# Patient Record
Sex: Female | Born: 1995 | Marital: Single | Smoking: Never smoker
Health system: Southern US, Community
[De-identification: ages and names within clinical notes are randomized; demographics above are authoritative.]

## PROBLEM LIST (undated history)

## (undated) ENCOUNTER — Emergency Department (HOSPITAL_COMMUNITY): Payer: Managed Care, Other (non HMO)

## (undated) HISTORY — PX: TONSILLECTOMY: SUR1361

## (undated) HISTORY — PX: FOOT SURGERY: SHX648

---
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
## 2018-12-05 ENCOUNTER — Encounter (HOSPITAL_BASED_OUTPATIENT_CLINIC_OR_DEPARTMENT_OTHER): Payer: Self-pay

## 2018-12-05 ENCOUNTER — Other Ambulatory Visit: Payer: Self-pay

## 2018-12-05 ENCOUNTER — Emergency Department (HOSPITAL_BASED_OUTPATIENT_CLINIC_OR_DEPARTMENT_OTHER)
Admission: EM | Admit: 2018-12-05 | Discharge: 2018-12-05 | Disposition: A | Discharge status: Home / Self Care | Payer: BLUE CROSS/BLUE SHIELD | Attending: Emergency Medicine | Admitting: Emergency Medicine

## 2018-12-05 DIAGNOSIS — M5432 Sciatica, left side: Secondary | ICD-10-CM

## 2018-12-05 DIAGNOSIS — M545 Low back pain: Secondary | ICD-10-CM | POA: Diagnosis present

## 2018-12-05 MED ORDER — METHOCARBAMOL 500 MG PO TABS: 500.0000 mg | ORAL_TABLET | Freq: Three times a day (TID) | ORAL | 0 refills | Status: DC | PRN

## 2018-12-05 MED ORDER — PREDNISONE 50 MG PO TABS
60.0000 mg | ORAL_TABLET | Freq: Once | ORAL | Status: AC
  Administered 2018-12-05: 60 mg via ORAL
  Filled 2018-12-05: qty 1

## 2018-12-05 MED ORDER — IBUPROFEN 200 MG PO TABS: 600.0000 mg | ORAL_TABLET | Freq: Three times a day (TID) | ORAL | 0 refills | Status: DC | PRN

## 2018-12-05 MED ORDER — PREDNISONE 20 MG PO TABS: 40.0000 mg | ORAL_TABLET | Freq: Every day | ORAL | 0 refills | Status: DC

## 2018-12-05 MED ORDER — OXYCODONE-ACETAMINOPHEN 5-325 MG PO TABS
1.0000 | ORAL_TABLET | Freq: Once | ORAL | Status: AC
  Administered 2018-12-05: 1 via ORAL
  Filled 2018-12-05: qty 1

## 2018-12-05 NOTE — ED Triage Notes (Signed)
Pt reports chronic back pain first began 2 years ago.  Has seen pmd and neurosurgery consult without improvement.  Current pain worsened since yesterday.  Took ibuprofen last night with little improvement.  Pt able to ambulate but with pain.  Pain located lower left area of back.  Pain shoots down left leg at times.

## 2018-12-06 NOTE — ED Provider Notes (Signed)
[facility] EMERGENCY DEPARTMENT Provider Note   CSN: [id number] Arrival date & time: 12/05/18  8453    History   Chief Complaint Chief Complaint  Patient presents with  . Back Pain    HPI Melinda Meyer is a 23 y.o. female.     HPI Patient is a 23 year old female presents with low back pain radiation down her left buttock.  She states pain is been intermittent over the past 2 years.  She was diagnosed with an L5-S1 herniated disc with left foraminal encroachment 2 years ago based on MRI.  She states that she continues having intermittent pain.  Her pain is not changed is just been persistent.  She is able to ambulate but reports pain.  No fevers or chills.  She tried ibuprofen last night without significant improvement.  Her current episode is been worsening since last night.  No weakness of her lower extremities.  No bowel or bladder complaints.  Denies abdominal pain.   History reviewed. No pertinent past medical history.  There are no active problems to display for this patient.   Past Surgical History:  Procedure Laterality Date  . FOOT SURGERY    . TONSILLECTOMY       OB History   No obstetric history on file.      Home Medications    Prior to Admission medications   Medication Sig Start Date End Date Taking? Authorizing Provider  ibuprofen (ADVIL,MOTRIN) 200 MG tablet Take 3 tablets (600 mg total) by mouth every 8 (eight) hours as needed for moderate pain. 12/05/18   Azalia Bilis, MD  methocarbamol (ROBAXIN) 500 MG tablet Take 1 tablet (500 mg total) by mouth every 8 (eight) hours as needed for muscle spasms. 12/05/18   Azalia Bilis, MD  predniSONE (DELTASONE) 20 MG tablet Take 2 tablets (40 mg total) by mouth daily. 12/05/18   Azalia Bilis, MD    Family History History reviewed. No pertinent family history.  Social History Social History   Tobacco Use  . Smoking status: Never Smoker  . Smokeless tobacco: Never Used  Substance Use Topics  .  Alcohol use: No  . Drug use: No     Allergies   Patient has no known allergies.   Review of Systems Review of Systems  All other systems reviewed and are negative.    Physical Exam Updated Vital Signs BP 124/77 (BP Location: Right Arm)   Pulse 84   Temp 97.7 F (36.5 C) (Oral)   Resp 16   Ht 5\' 8"  (1.727 m)   Wt 68 kg   SpO2 100%   BMI 22.81 kg/m   Physical Exam Vitals signs and nursing note reviewed.  Constitutional:      Appearance: She is well-developed.  HENT:     Head: Normocephalic.  Neck:     Musculoskeletal: Normal range of motion.  Pulmonary:     Effort: Pulmonary effort is normal.  Abdominal:     General: There is no distension.  Musculoskeletal:     Comments: Full range of motion of bilateral hips, knees, ankles.  5 out of 5 strength in bilateral lower extremity major muscle groups.  No L-spine point tenderness.  Tenderness in the left sciatic groove  Neurological:     Mental Status: She is alert and oriented to person, place, and time.      ED Treatments / Results  Labs (all labs ordered are listed, but only abnormal results are displayed) Labs Reviewed - No data to display  EKG None  Radiology No results found.  Procedures Procedures (including critical care time)  Medications Ordered in ED Medications  predniSONE (DELTASONE) tablet 60 mg (60 mg Oral Given 12/05/18 1008)  oxyCODONE-acetaminophen (PERCOCET/ROXICET) 5-325 MG per tablet 1 tablet (1 tablet Oral Given 12/05/18 1008)     Initial Impression / Assessment and Plan / ED Course  I have reviewed the triage vital signs and the nursing notes.  Pertinent labs & imaging results that were available during my care of the patient were reviewed by me and considered in my medical decision making (see chart for details).        Left-sided sciatica.  No weakness in her lower extremities.  No indication for advanced imaging.  Home with Robaxin, prednisone, Advil.  She understands to  follow-up with her primary care physician and her neurosurgical team.  Recommended stretching and physical therapy.  Final Clinical Impressions(s) / ED Diagnoses   Final diagnoses:  Sciatica, left side    ED Discharge Orders         Ordered    methocarbamol (ROBAXIN) 500 MG tablet  Every 8 hours PRN     12/05/18 1014    predniSONE (DELTASONE) 20 MG tablet  Daily     12/05/18 1014    ibuprofen (ADVIL,MOTRIN) 200 MG tablet  Every 8 hours PRN     12/05/18 1014           Azalia Bilis, MD 12/06/18 [phone]

## 2020-03-14 ENCOUNTER — Other Ambulatory Visit: Payer: Self-pay

## 2020-03-14 ENCOUNTER — Encounter (HOSPITAL_BASED_OUTPATIENT_CLINIC_OR_DEPARTMENT_OTHER): Payer: Self-pay | Admitting: Emergency Medicine

## 2020-03-14 ENCOUNTER — Emergency Department (HOSPITAL_BASED_OUTPATIENT_CLINIC_OR_DEPARTMENT_OTHER): Payer: Managed Care, Other (non HMO)

## 2020-03-14 ENCOUNTER — Emergency Department (HOSPITAL_BASED_OUTPATIENT_CLINIC_OR_DEPARTMENT_OTHER)
Admission: EM | Admit: 2020-03-14 | Discharge: 2020-03-14 | Disposition: A | Discharge status: Home / Self Care | Payer: Managed Care, Other (non HMO) | Attending: Emergency Medicine | Admitting: Emergency Medicine

## 2020-03-14 DIAGNOSIS — M2540 Effusion, unspecified joint: Secondary | ICD-10-CM | POA: Insufficient documentation

## 2020-03-14 DIAGNOSIS — W502XXA Accidental twist by another person, initial encounter: Secondary | ICD-10-CM | POA: Insufficient documentation

## 2020-03-14 DIAGNOSIS — Y9389 Activity, other specified: Secondary | ICD-10-CM | POA: Insufficient documentation

## 2020-03-14 DIAGNOSIS — Y999 Unspecified external cause status: Secondary | ICD-10-CM | POA: Diagnosis not present

## 2020-03-14 DIAGNOSIS — M25571 Pain in right ankle and joints of right foot: Secondary | ICD-10-CM | POA: Diagnosis not present

## 2020-03-14 DIAGNOSIS — Y929 Unspecified place or not applicable: Secondary | ICD-10-CM | POA: Insufficient documentation

## 2020-03-14 DIAGNOSIS — S99919A Unspecified injury of unspecified ankle, initial encounter: Secondary | ICD-10-CM | POA: Diagnosis present

## 2020-03-14 MED ORDER — ONDANSETRON 4 MG PO TBDP
4.0000 mg | ORAL_TABLET | Freq: Once | ORAL | Status: AC
  Administered 2020-03-14: 4 mg via ORAL
  Filled 2020-03-14: qty 1

## 2020-03-14 MED ORDER — OXYCODONE-ACETAMINOPHEN 5-325 MG PO TABS
1.0000 | ORAL_TABLET | ORAL | Status: DC | PRN
  Administered 2020-03-14: 1 via ORAL
  Filled 2020-03-14: qty 1

## 2020-03-14 NOTE — ED Provider Notes (Signed)
[facility] EMERGENCY DEPARTMENT Provider Note   CSN: [id number] Arrival date & time: 03/14/20  1601     History Chief Complaint  Patient presents with  . Ankle Injury    Melinda Meyer is a 24 y.o. female with no pertinent past medical history who presents today for evaluation of pain in her right ankle.  She reports that she twisted her ankle.  She denies any other injuries.  No pain in her proximal right lower leg.  She denies any numbness or weakness.  States that she has been unable to bear weight due to pain.   No interventions tried prior to arrival.  She reports that her pain is made better with nonweightbearing and being still. HPI     History reviewed. No pertinent past medical history.  There are no problems to display for this patient.   Past Surgical History:  Procedure Laterality Date  . FOOT SURGERY    . TONSILLECTOMY       OB History   No obstetric history on file.     No family history on file.  Social History   Tobacco Use  . Smoking status: Never Smoker  . Smokeless tobacco: Never Used  Substance Use Topics  . Alcohol use: No  . Drug use: No    Home Medications Prior to Admission medications   Medication Sig Start Date End Date Taking? Authorizing Provider  ibuprofen (ADVIL,MOTRIN) 200 MG tablet Take 3 tablets (600 mg total) by mouth every 8 (eight) hours as needed for moderate pain. 12/05/18   Azalia Bilis, MD  methocarbamol (ROBAXIN) 500 MG tablet Take 1 tablet (500 mg total) by mouth every 8 (eight) hours as needed for muscle spasms. 12/05/18   Azalia Bilis, MD  predniSONE (DELTASONE) 20 MG tablet Take 2 tablets (40 mg total) by mouth daily. 12/05/18   Azalia Bilis, MD    Allergies    Patient has no known allergies.  Review of Systems   Review of Systems  Constitutional: Negative for fever.  Musculoskeletal: Positive for joint swelling.  Skin: Negative for wound.  Neurological: Negative for weakness.  All other systems  reviewed and are negative.   Physical Exam Updated Vital Signs BP 123/72 (BP Location: Left Arm)   Pulse 80   Temp 98.7 F (37.1 C) (Oral)   Resp 16   Ht 5' 7.5" (1.715 m)   Wt 75.8 kg   SpO2 99%   BMI 25.77 kg/m   Physical Exam Vitals and nursing note reviewed.  Constitutional:      General: She is not in acute distress.    Appearance: She is not ill-appearing.  HENT:     Head: Normocephalic.  Cardiovascular:     Rate and Rhythm: Normal rate.     Comments: 2+ right DP/PT pulse. Pulmonary:     Effort: Pulmonary effort is normal. No respiratory distress.  Musculoskeletal:     Comments: There is obvious edema around the right lateral malleolus.  There is no pain or tenderness to palpation in the proximal right lower leg.  There is no pain or tenderness to palpation into the right foot.  Skin:    Comments: Ecchymosis over the right lateral malleolus without abrasion, laceration or skin break.  Neurological:     Mental Status: She is alert.     Comments: Sensation intact to light touch to right lower extremity/foot.     ED Results / Procedures / Treatments   Labs (all labs ordered are listed, but  only abnormal results are displayed) Labs Reviewed - No data to display  EKG None  Radiology DG Ankle Complete Right  Result Date: 03/14/2020 CLINICAL DATA:  RIGHT ankle pain after twist injury tonight, pain and swelling at lateral malleolus, inability to dorsiflex foot EXAM: RIGHT ANKLE - COMPLETE 3+ VIEW COMPARISON:  None FINDINGS: Lateral soft tissue swelling. Osseous mineralization normal. Joint spaces preserved. No acute fracture, dislocation, or bone destruction. IMPRESSION: No acute osseous abnormalities. Electronically Signed   By: Lavonia Dana M.D.   On: 03/14/2020 16:57    Procedures Procedures (including critical care time)  Medications Ordered in ED Medications  oxyCODONE-acetaminophen (PERCOCET/ROXICET) 5-325 MG per tablet 1 tablet (1 tablet Oral Given 03/14/20  1715)  ondansetron (ZOFRAN-ODT) disintegrating tablet 4 mg (4 mg Oral Given 03/14/20 1708)    ED Course  I have reviewed the triage vital signs and the nursing notes.  Pertinent labs & imaging results that were available during my care of the patient were reviewed by me and considered in my medical decision making (see chart for details).    MDM Rules/Calculators/A&P                         Leandrew Koyanagi Presents with right ankle pain after she "twisted" her ankle consistent with an ankle sprain/strain.  The affected ankle has mild edema and is tender on the lateral  aspect.  X-rays were obtained with out acute osseous abnormality. The skin is intact to ankle/foot.  The foot is warm and well perfused with intact sensation.  Motor function is limited secondary to pain.  Patient given instructions for OTC pain medication, ACE wrap, crtuches.  Patient advised to follow up with orthopedics if symptoms persist for longer than one week.  Patient was given the option to ask questions, all of which were answered to the best of my ability.  Return precautions were discussed with patient who states their understanding.  At the time of discharge patient denied any unaddressed complaints or concerns.  Patient is agreeable for discharge home.  Note: Portions of this report may have been transcribed using voice recognition software. Every effort was made to ensure accuracy; however, inadvertent computerized transcription errors may be present Final Clinical Impression(s) / ED Diagnoses Final diagnoses:  Acute right ankle pain    Rx / DC Orders ED Discharge Orders    None       Lorin Glass, PA-C 03/14/20 1829    Malvin Johns, MD 03/14/20 [phone]

## 2020-03-14 NOTE — ED Triage Notes (Signed)
Twisted right ankle while standing today.  Swelling to lateral malleolus.  Did not fall.

## 2020-03-14 NOTE — Discharge Instructions (Signed)
Please take Ibuprofen (Advil, motrin) and Tylenol (acetaminophen) to relieve your pain.  You may take up to 600 MG (3 pills) of normal strength ibuprofen every 8 hours as needed.  In between doses of ibuprofen you make take tylenol, up to 1,000 mg (two extra strength pills).  Do not take more than 3,000 mg tylenol in a 24 hour period.  Please check all medication labels as many medications such as pain and cold medications may contain tylenol.  Do not drink alcohol while taking these medications.  Do not take other NSAID'S while taking ibuprofen (such as aleve or naproxen).  Please take ibuprofen with food to decrease stomach upset. ° °Today you received medications that may make you sleepy or impair your ability to make decisions.  For the next 24 hours please do not drive, operate heavy machinery, care for a small child with out another adult present, or perform any activities that may cause harm to you or someone else if you were to fall asleep or be impaired.  ° ° °

## 2020-03-20 ENCOUNTER — Encounter: Payer: Self-pay | Admitting: Family Medicine

## 2020-03-20 ENCOUNTER — Ambulatory Visit (INDEPENDENT_AMBULATORY_CARE_PROVIDER_SITE_OTHER): Payer: Managed Care, Other (non HMO) | Admitting: Family Medicine

## 2020-03-20 ENCOUNTER — Other Ambulatory Visit: Payer: Self-pay

## 2020-03-20 ENCOUNTER — Ambulatory Visit: Payer: Self-pay

## 2020-03-20 VITALS — BP 131/79 | HR 73 | Ht 68.0 in | Wt 165.0 lb

## 2020-03-20 DIAGNOSIS — S93491A Sprain of other ligament of right ankle, initial encounter: Secondary | ICD-10-CM

## 2020-03-20 DIAGNOSIS — M25571 Pain in right ankle and joints of right foot: Secondary | ICD-10-CM

## 2020-03-20 NOTE — Progress Notes (Signed)
Melinda Meyer - 24 y.o. female MRN [id number]  Date of birth: 1996-07-05  SUBJECTIVE:  Including CC & ROS.  Chief Complaint  Patient presents with  . Ankle Injury    right x 03/14/2020    Melinda Meyer is a 24 y.o. female that is presenting with right ankle pain.  She had an inversion injury about a week ago.  Since that time she has had ongoing swelling and pain over the lateral malleolus.  No history of similar pain.  Has been using crutches.  Denies any numbness or tingling.  Independent review of the right ankle x-ray from 6/24 shows some soft tissue swelling laterally.   Review of Systems See HPI   HISTORY: Past Medical, Surgical, Social, and Family History Reviewed & Updated per EMR.   Pertinent Historical Findings include:  No past medical history on file.  Past Surgical History:  Procedure Laterality Date  . FOOT SURGERY    . TONSILLECTOMY      No family history on file.  Social History   Socioeconomic History  . Marital status: Single    Spouse name: Not on file  . Number of children: Not on file  . Years of education: Not on file  . Highest education level: Not on file  Occupational History  . Not on file  Tobacco Use  . Smoking status: Never Smoker  . Smokeless tobacco: Never Used  Substance and Sexual Activity  . Alcohol use: No  . Drug use: No  . Sexual activity: Not on file  Other Topics Concern  . Not on file  Social History Narrative  . Not on file   Social Determinants of Health   Financial Resource Strain:   . Difficulty of Paying Living Expenses:   Food Insecurity:   . Worried About Programme researcher, broadcasting/film/video in the Last Year:   . Barista in the Last Year:   Transportation Needs:   . Freight forwarder (Medical):   Marland Kitchen Lack of Transportation (Non-Medical):   Physical Activity:   . Days of Exercise per Week:   . Minutes of Exercise per Session:   Stress:   . Feeling of Stress :   Social Connections:   . Frequency of  Communication with Friends and Family:   . Frequency of Social Gatherings with Friends and Family:   . Attends Religious Services:   . Active Member of Clubs or Organizations:   . Attends Banker Meetings:   Marland Kitchen Marital Status:   Intimate Partner Violence:   . Fear of Current or Ex-Partner:   . Emotionally Abused:   Marland Kitchen Physically Abused:   . Sexually Abused:      PHYSICAL EXAM:  VS: BP 131/79   Pulse 73   Ht 5\' 8"  (1.727 m)   Wt 165 lb (74.8 kg)   BMI 25.09 kg/m  Physical Exam Gen: NAD, alert, cooperative with exam, well-appearing MSK:  Right ankle: Significant effusion Ecchymosis over the lateral malleolus. Limited plantar flexion dorsiflexion. Pain with anterior drawer. Tender to palpation over the ATFL Neurovascularly intact  Limited ultrasound: Right ankle:  No effusion noted in the ankle joint. No changes of the distal fibula or lateral malleolus. No changes of the distal tibia. Normal-appearing peroneal tendons at the lateral malleolus. Significant hypoechoic change around the ATFL.  Hyperemia associated with the this area. Normal-appearing base of the fifth. No changes of the Lisfranc ligament.   Summary: Findings would suggest an ankle sprain of greater severity.  Ultrasound and interpretation by Clare Gandy, MD    ASSESSMENT & PLAN:   Sprain of anterior talofibular ligament of right ankle Initial injury on 6/24.  Changes on ultrasound appear mostly involving the ATFL.  No bony changes on previous imaging or ultrasound today. -Counseled on home exercise therapy and supportive care. -Cam walker. -Duexis samples. -Follow-up in 2 weeks.  Would reimage at that time.

## 2020-03-20 NOTE — Progress Notes (Signed)
Medication Samples have been provided to the patient.  Drug name: Duexis       Strength: 800mg /26.6mg         Qty: 2 boxes  LOT:  Exp.Date: 11/2020  Dosing instructions: take 1 tablet by mouth three (3) times a day.  The patient has been instructed regarding the correct time, dose, and frequency of taking this medication, including desired effects and most common side effects.   12/2020, MA 4:11 PM 03/20/2020

## 2020-03-20 NOTE — Patient Instructions (Signed)
Nice to meet you Please try ice  Please try the range of motion movements  Please continue the crutches for another week. Then you can try transitioning to using the boot  Please send me a message in MyChart with any questions or updates.  Please see me back in 2 weeks.   --Dr. Jordan Likes

## 2020-03-20 NOTE — Assessment & Plan Note (Signed)
Initial injury on 6/24.  Changes on ultrasound appear mostly involving the ATFL.  No bony changes on previous imaging or ultrasound today. -Counseled on home exercise therapy and supportive care. -Cam walker. -Duexis samples. -Follow-up in 2 weeks.  Would reimage at that time.

## 2020-04-03 ENCOUNTER — Encounter: Payer: Self-pay | Admitting: Family Medicine

## 2020-04-03 ENCOUNTER — Other Ambulatory Visit: Payer: Self-pay

## 2020-04-03 ENCOUNTER — Ambulatory Visit (HOSPITAL_BASED_OUTPATIENT_CLINIC_OR_DEPARTMENT_OTHER)
Admission: RE | Admit: 2020-04-03 | Discharge: 2020-04-03 | Disposition: A | Discharge status: Home / Self Care | Payer: Managed Care, Other (non HMO) | Source: Ambulatory Visit | Attending: Family Medicine | Admitting: Family Medicine

## 2020-04-03 ENCOUNTER — Ambulatory Visit (INDEPENDENT_AMBULATORY_CARE_PROVIDER_SITE_OTHER): Payer: Managed Care, Other (non HMO) | Admitting: Family Medicine

## 2020-04-03 VITALS — BP 119/75 | HR 80 | Ht 68.0 in | Wt 167.0 lb

## 2020-04-03 DIAGNOSIS — S93491D Sprain of other ligament of right ankle, subsequent encounter: Secondary | ICD-10-CM | POA: Insufficient documentation

## 2020-04-03 NOTE — Assessment & Plan Note (Signed)
Significant tenderness with palpation of the base of the fifth as well as the lateral malleolus.  Pain with weightbearing.  Concern for occult fracture. -Counseled on home exercise therapy and supportive care. -Foot and ankle x-ray. -Counseled on weaning out of the boot.  If too painful may need to consider an MRI to evaluate for OCD lesion. -Follow-up in 4 weeks.

## 2020-04-03 NOTE — Progress Notes (Signed)
  Melinda Meyer - 24 y.o. female MRN [id number]  Date of birth: [birth year]  SUBJECTIVE:  Including CC & ROS.  Chief Complaint  Patient presents with  . Follow-up    right ankle    Melinda Meyer is a 24 y.o. female that is following up for her right ankle pain.  Has been using the boot.  Still having ecchymosis and swelling of the lateral aspect.  Still pain with ambulation.  Unable to bear weight without significant pain.   Review of Systems See HPI   HISTORY: Past Medical, Surgical, Social, and Family History Reviewed & Updated per EMR.   Pertinent Historical Findings include:  No past medical history on file.  Past Surgical History:  Procedure Laterality Date  . FOOT SURGERY    . TONSILLECTOMY      No family history on file.  Social History   Socioeconomic History  . Marital status: Single    Spouse name: Not on file  . Number of children: Not on file  . Years of education: Not on file  . Highest education level: Not on file  Occupational History  . Not on file  Tobacco Use  . Smoking status: Never Smoker  . Smokeless tobacco: Never Used  Substance and Sexual Activity  . Alcohol use: No  . Drug use: No  . Sexual activity: Not on file  Other Topics Concern  . Not on file  Social History Narrative  . Not on file   Social Determinants of Health   Financial Resource Strain:   . Difficulty of Paying Living Expenses:   Food Insecurity:   . Worried About Programme researcher, broadcasting/film/video in the Last Year:   . Barista in the Last Year:   Transportation Needs:   . Freight forwarder (Medical):   Marland Kitchen Lack of Transportation (Non-Medical):   Physical Activity:   . Days of Exercise per Week:   . Minutes of Exercise per Session:   Stress:   . Feeling of Stress :   Social Connections:   . Frequency of Communication with Friends and Family:   . Frequency of Social Gatherings with Friends and Family:   . Attends Religious Services:   . Active Member of Clubs or  Organizations:   . Attends Banker Meetings:   Marland Kitchen Marital Status:   Intimate Partner Violence:   . Fear of Current or Ex-Partner:   . Emotionally Abused:   Marland Kitchen Physically Abused:   . Sexually Abused:      PHYSICAL EXAM:  VS: BP 119/75   Pulse 80   Ht 5\' 8"  (1.727 m)   Wt 167 lb (75.8 kg)   BMI 25.39 kg/m  Physical Exam Gen: NAD, alert, cooperative with exam, well-appearing MSK:  Right ankle and foot: Ecchymosis and swelling to the lateral aspect. Limited range of motion. Tenderness palpation at the base of the fifth as well as the distal fibula. Tenderness palpation of the ATFL. Neurovascularly intact     ASSESSMENT & PLAN:   Sprain of anterior talofibular ligament of right ankle Significant tenderness with palpation of the base of the fifth as well as the lateral malleolus.  Pain with weightbearing.  Concern for occult fracture. -Counseled on home exercise therapy and supportive care. -Foot and ankle x-ray. -Counseled on weaning out of the boot.  If too painful may need to consider an MRI to evaluate for OCD lesion. -Follow-up in 4 weeks.

## 2020-04-03 NOTE — Patient Instructions (Signed)
Good to see you Please continue ice  Please try to wean out of the boot in 2 weeks. This may change on if we see anything in the xrays.  Please try an ankle brace after the boot  Please continue the exercises  I will call with the results from today  Please send me a message in MyChart with any questions or updates.  Please see me back in 4 weeks .   --Dr. Jordan Likes

## 2020-04-15 ENCOUNTER — Telehealth: Payer: Self-pay | Admitting: Family Medicine

## 2020-04-15 NOTE — Telephone Encounter (Signed)
Left VM for patient. If she calls back please have her speak with a nurse/CMA and inform that her xrays are normal. If she is still having pain with walking we may need to try physical therapy or proceed with an MRI.   If any questions then please take the best time and phone number to call and I will try to call her back.   Myra Rude, MD [facility] 04/15/2020, 8:29 AM

## 2022-05-10 IMAGING — CR DG ANKLE COMPLETE 3+V*R*
3 series · 3 of 3 positions shown · non-contrast
Comparison: None.

CLINICAL DATA: Injury 2 weeks ago with ankle pain

EXAM:
RIGHT ANKLE - COMPLETE 3+ VIEW

[t ankle joint ap right]
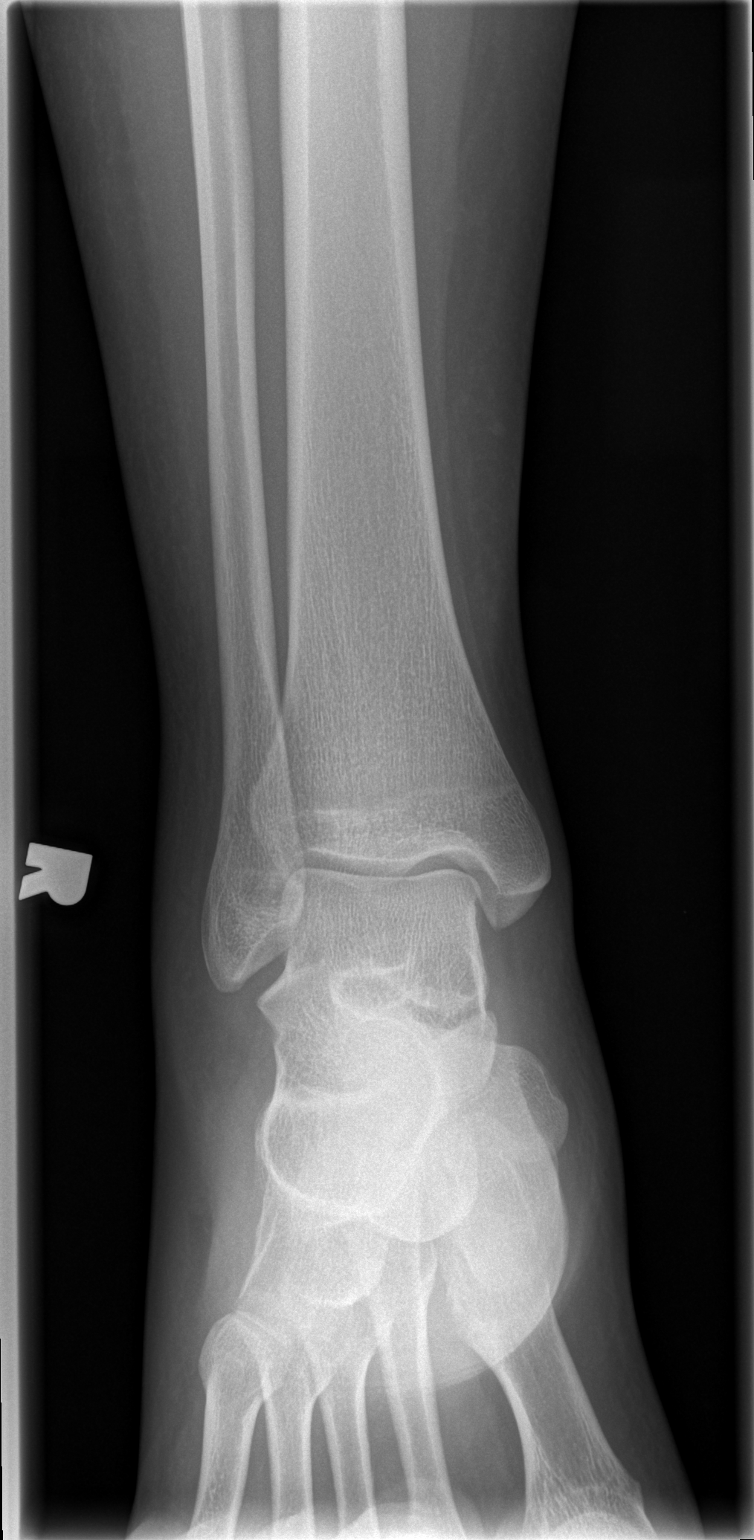

[t ankle joint oblique right]
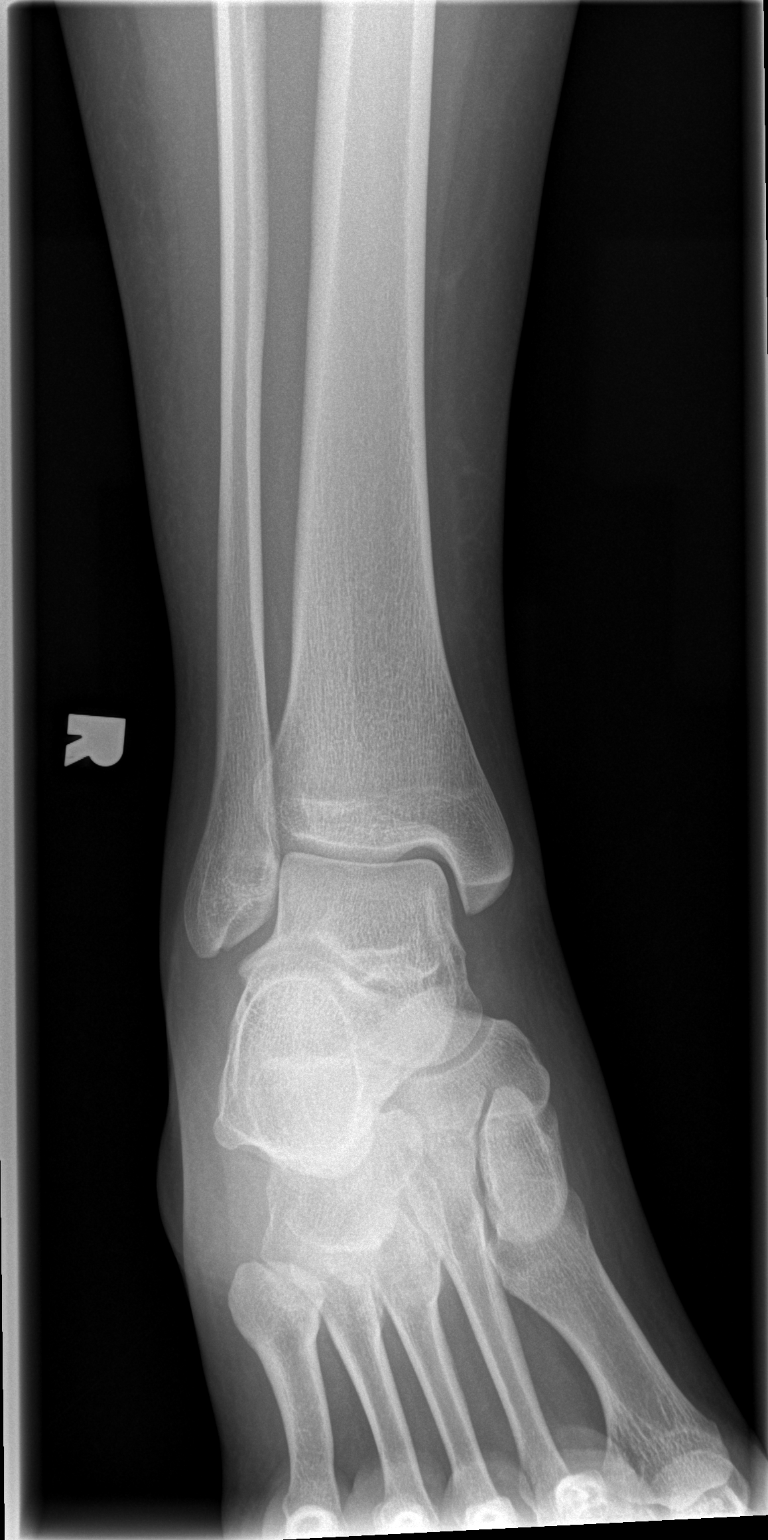

[t ankle joint lat right]
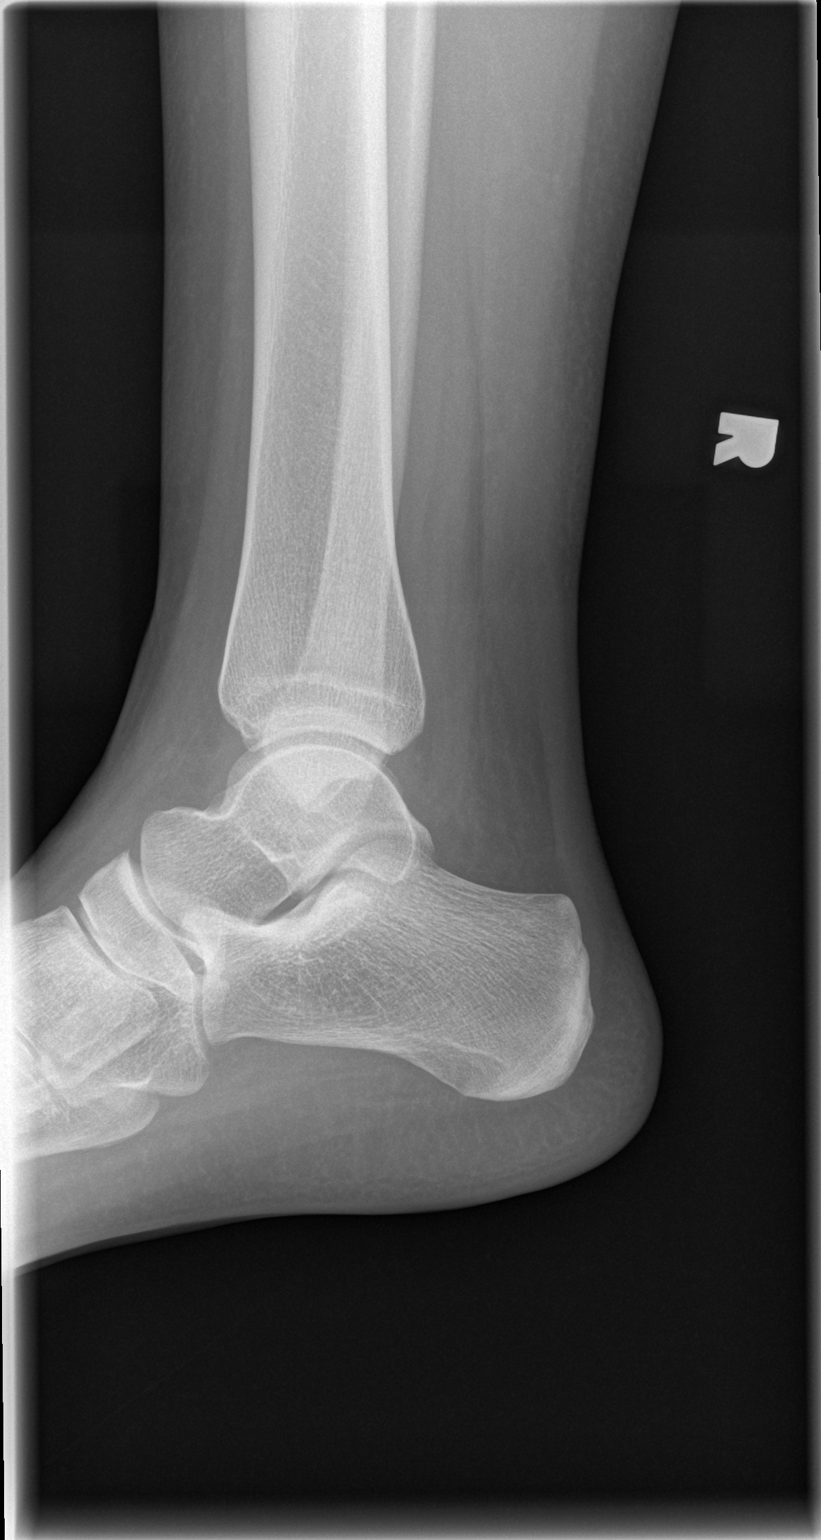

[3 of 3 positions shown; findings below may reference images not displayed]

FINDINGS: There is no evidence of fracture, dislocation, or joint effusion.
There is no evidence of arthropathy or other focal bone abnormality.
Soft tissues are unremarkable.
IMPRESSION: Normal radiographs

## 2022-05-10 IMAGING — CR DG FOOT COMPLETE 3+V*R*
3 series · 3 of 3 positions shown · non-contrast
Comparison: None.

CLINICAL DATA: Injury 2 weeks ago with right foot pain.

EXAM:
RIGHT FOOT COMPLETE - 3+ VIEW

[t foot ap right]
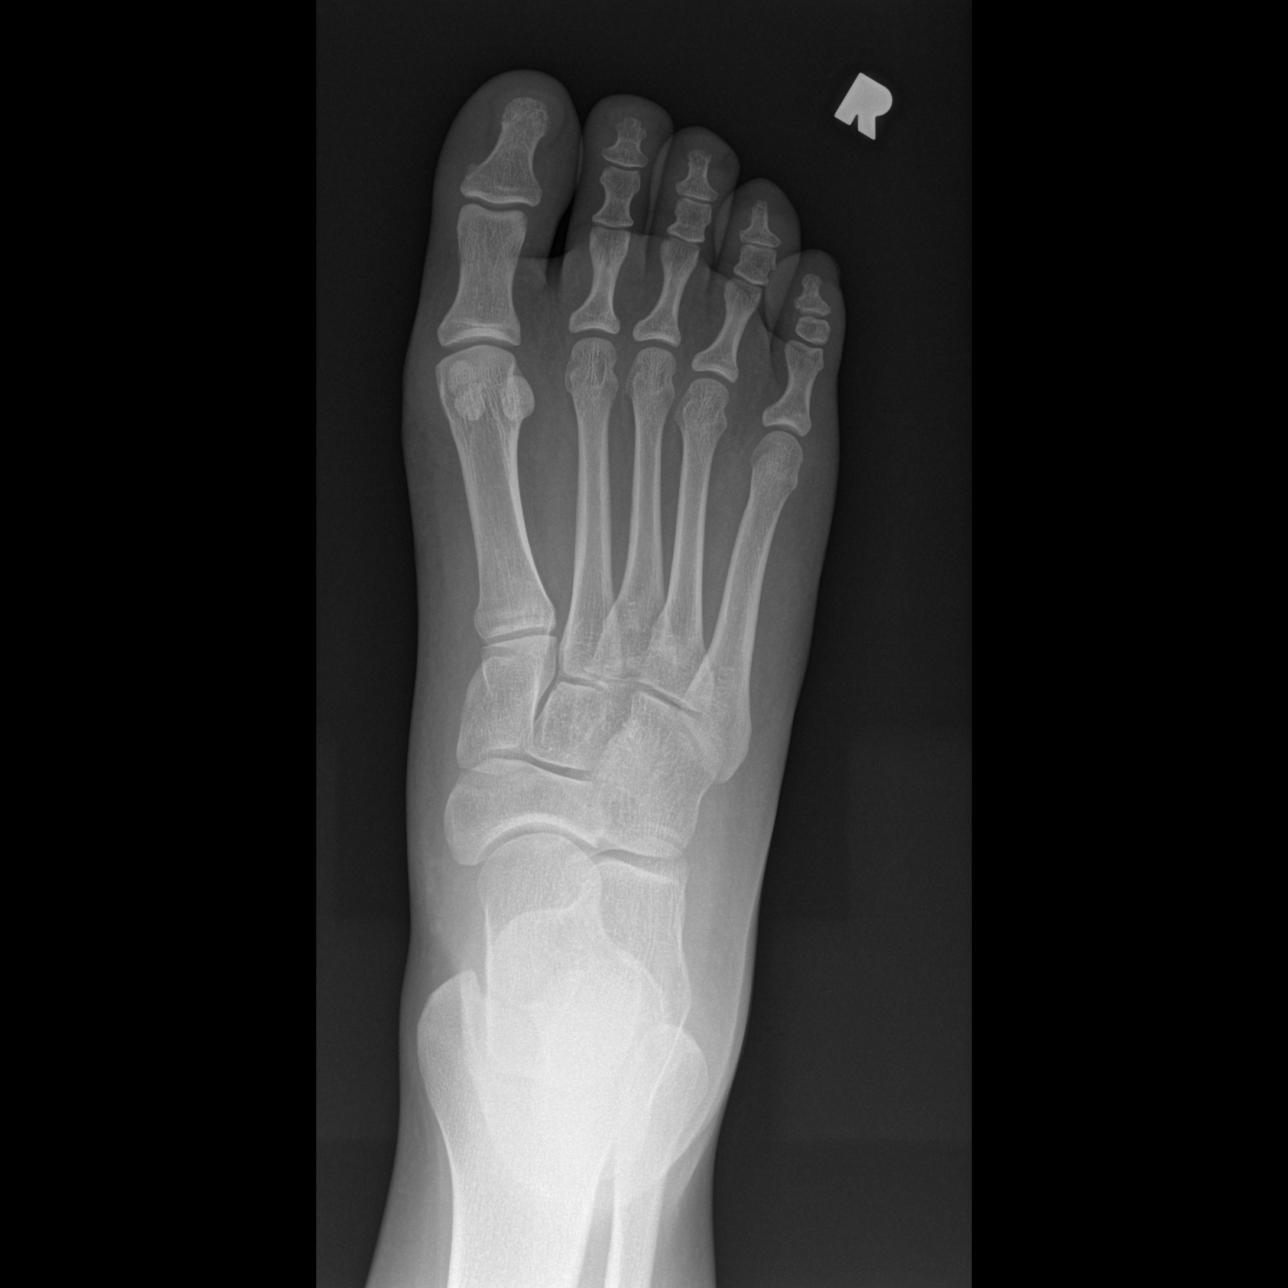

[t foot oblique right]
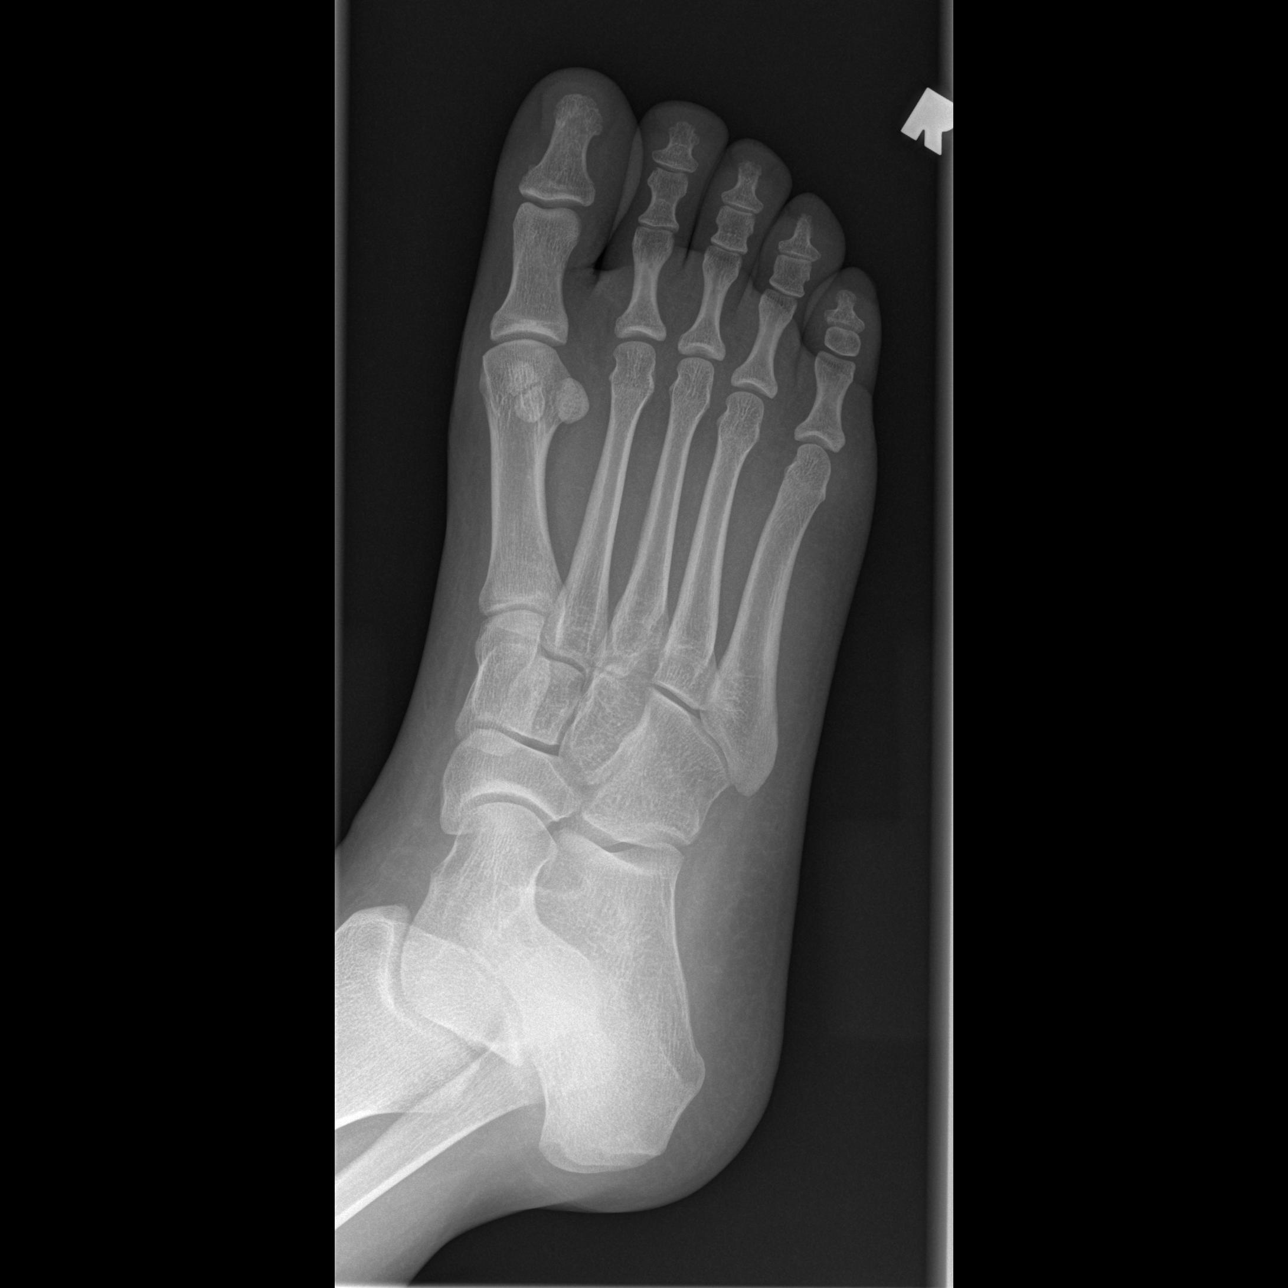

[t foot lat right]
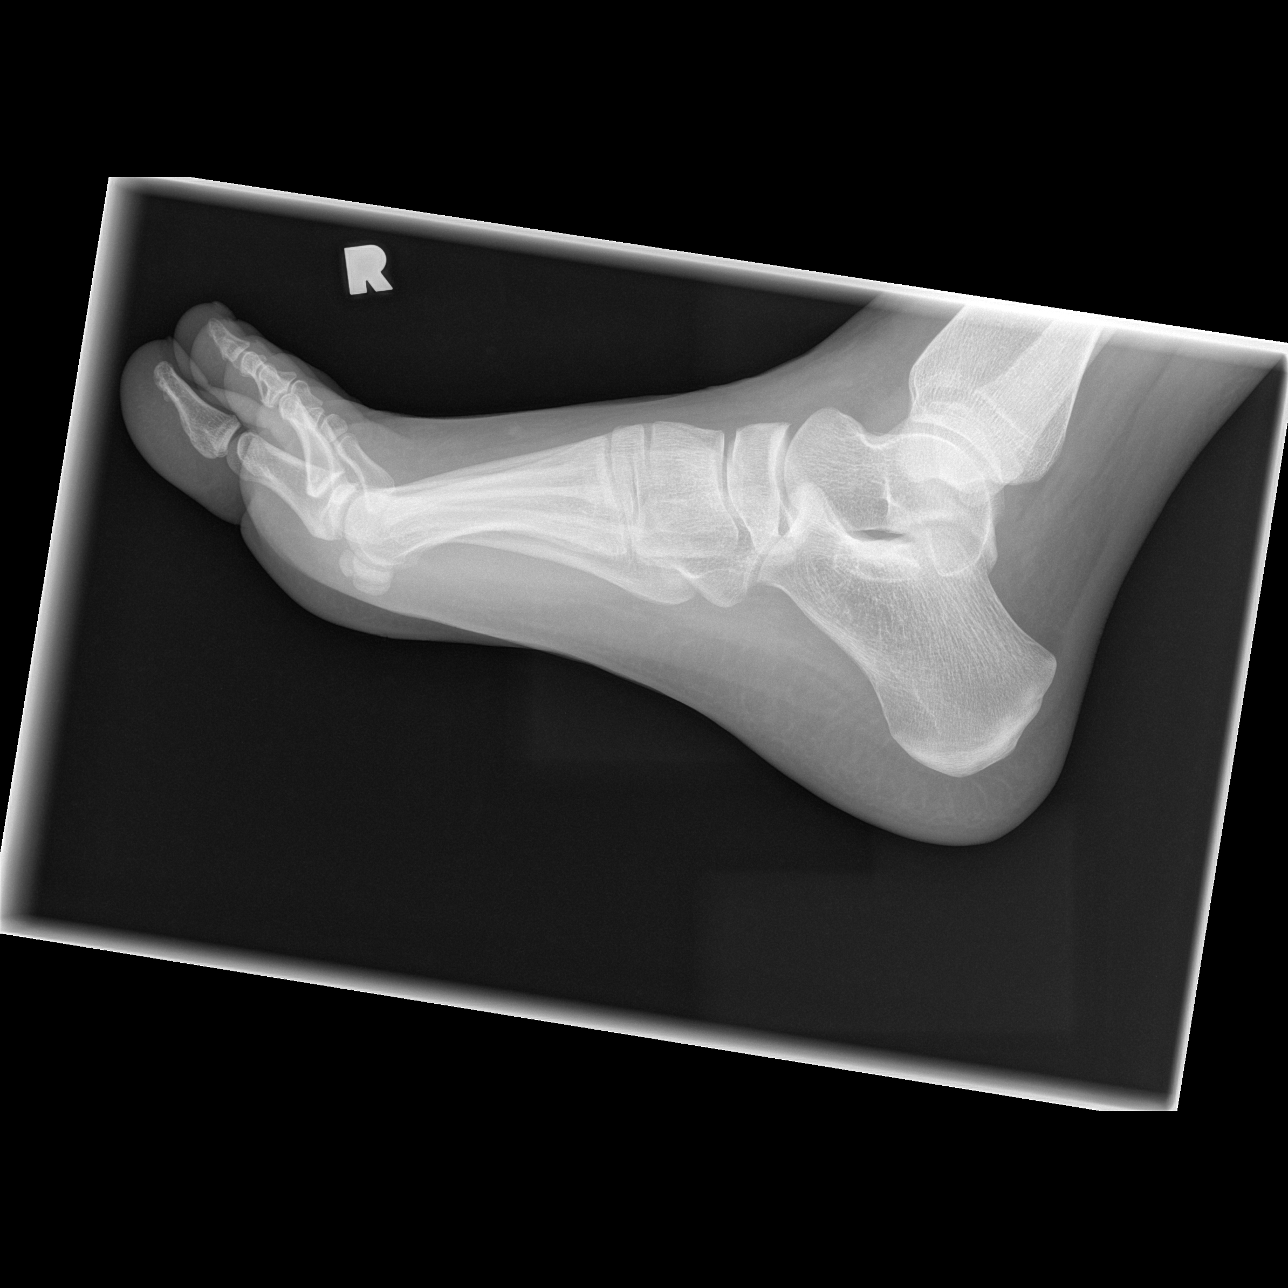

[3 of 3 positions shown; findings below may reference images not displayed]

FINDINGS: There is no evidence of fracture or dislocation. There is no
evidence of arthropathy or other focal bone abnormality. Soft
tissues are unremarkable.
IMPRESSION: Normal radiographs.

## 2023-01-07 ENCOUNTER — Encounter: Payer: Self-pay | Admitting: *Deleted

## 2023-01-28 ENCOUNTER — Ambulatory Visit: Payer: Managed Care, Other (non HMO) | Admitting: Family Medicine

## 2023-01-28 ENCOUNTER — Encounter: Payer: Self-pay | Admitting: General Practice

## 2023-01-28 ENCOUNTER — Other Ambulatory Visit: Payer: Self-pay

## 2023-01-28 VITALS — BP 130/74 | HR 92 | Ht 63.0 in | Wt 174.0 lb

## 2023-01-28 DIAGNOSIS — Z3A01 Less than 8 weeks gestation of pregnancy: Secondary | ICD-10-CM

## 2023-01-28 DIAGNOSIS — O26891 Other specified pregnancy related conditions, first trimester: Secondary | ICD-10-CM | POA: Diagnosis not present

## 2023-01-28 DIAGNOSIS — R109 Unspecified abdominal pain: Secondary | ICD-10-CM | POA: Diagnosis not present

## 2023-01-28 DIAGNOSIS — O3680X0 Pregnancy with inconclusive fetal viability, not applicable or unspecified: Secondary | ICD-10-CM

## 2023-01-28 LAB — POCT PREGNANCY, URINE: Preg Test, Ur: POSITIVE — AB

## 2023-01-28 NOTE — Progress Notes (Signed)
  History:  [name] is a 27 y.o. G1P0000 who presents to clinic today with complaint of possible pregnancy.   UPT is positive here Patient was using nuvaring, took course of antibiotics and subsequently became pregnant Pregnancy is unplanned but welcome No medical conditions, does not take any medicines She has started taking prenatal vitamins Reports intermittent cramping No vaginal bleeding  No past medical history on file.  Past Surgical History:  Procedure Laterality Date   FOOT SURGERY     TONSILLECTOMY      The following portions of the patient's history were reviewed and updated as appropriate: allergies, current medications, past family history, past medical history, past social history, past surgical history and problem list.   Review of Systems:  Pertinent items noted in HPI and remainder of comprehensive ROS otherwise negative.  Objective:  Physical Exam BP 130/74   Pulse 92   Ht 5\' 3"  (1.6 m)   Wt 174 lb (78.9 kg)   BMI 30.82 kg/m  Physical Exam Vitals reviewed.  Constitutional:      General: She is not in acute distress.    Appearance: She is well-developed. She is not diaphoretic.  Eyes:     General: No scleral icterus. Pulmonary:     Effort: Pulmonary effort is normal. No respiratory distress.  Skin:    General: Skin is warm and dry.  Neurological:     Mental Status: She is alert.     Coordination: Coordination normal.      Labs and Imaging Results for orders placed or performed in visit on 01/28/23 (from the past 24 hour(s))  Pregnancy, urine POC     Status: Abnormal   Collection Time: 01/28/23  1:55 PM  Result Value Ref Range   Preg Test, Ur POSITIVE (A) NEGATIVE    No results found.  Bedside Ultrasound Documentation Pt informed that the ultrasound is considered a limited OB ultrasound and is not intended to be a complete ultrasound exam.  Patient also informed that the ultrasound is not being completed with the intent of  assessing for fetal or placental anomalies or any pelvic abnormalities.  Explained that the purpose of today's ultrasound is to assess for  viability.  Patient acknowledges the purpose of the exam and the limitations of the study.    My read: IUGS seen with possible faint outline of yolk sac. L adnexa unremarkable. R adnexa with two simple cysts together measuring about 6x4 cm.    Assessment & Plan:  1. Encounter to determine fetal viability of pregnancy, single or unspecified fetus IUGS seen on bedside US, low suspicion for ectopic Will schedule for formal viability scan in two weeks Already taking prenatals Interested in [facility] program, will coordinate new OB visit - US OB LESS THAN 14 WEEKS WITH OB TRANSVAGINAL; Future    Approximately 15 minutes of total time was spent with this patient on history taking, coordination of care, education and documentation.   Melinda Maples, MD 01/28/2023 2:36 PM

## 2023-01-28 NOTE — Progress Notes (Signed)
Patient presents to office today for UPT. UPT +. Patient reports first positive home test 2 weeks ago. LMP 12/14/22 EDD 09/20/23 [redacted]w[redacted]d. Allergies/meds reviewed. She reports occasional cramping like a period that is sporadic throughout the day rated at a 5 but denies bleeding. She is interested in mom/baby combined care.  Informal bedside ultrasound reveals intrauterine gestational sac, no yolk sac or fetal pole visualized. Dr Crissie Reese in to see patient.   Scheduled follow up ultrasound for dating/viability on 5/20 @ 11am. Informed patient she would be contacted with new OB appt and intake visit.   Lyla Son RN 01/28/23

## 2023-02-05 ENCOUNTER — Encounter: Payer: Self-pay | Admitting: Family Medicine

## 2023-02-08 ENCOUNTER — Ambulatory Visit (HOSPITAL_BASED_OUTPATIENT_CLINIC_OR_DEPARTMENT_OTHER)
Admission: RE | Admit: 2023-02-08 | Discharge: 2023-02-08 | Disposition: A | Discharge status: Home / Self Care | Payer: Managed Care, Other (non HMO) | Source: Ambulatory Visit | Attending: Family Medicine | Admitting: Family Medicine

## 2023-02-08 DIAGNOSIS — O3680X0 Pregnancy with inconclusive fetal viability, not applicable or unspecified: Secondary | ICD-10-CM

## 2023-02-09 ENCOUNTER — Encounter: Payer: Self-pay | Admitting: Family Medicine

## 2023-02-11 ENCOUNTER — Encounter: Payer: Self-pay | Admitting: Family Medicine

## 2023-02-11 ENCOUNTER — Telehealth: Payer: Self-pay | Admitting: General Practice

## 2023-02-11 DIAGNOSIS — N83209 Unspecified ovarian cyst, unspecified side: Secondary | ICD-10-CM | POA: Insufficient documentation

## 2023-02-11 DIAGNOSIS — Z3490 Encounter for supervision of normal pregnancy, unspecified, unspecified trimester: Secondary | ICD-10-CM | POA: Insufficient documentation

## 2023-02-11 NOTE — Telephone Encounter (Signed)
Called patient and informed her of results. Also reviewed dating information. Patient verbalized understanding and asked if there would be a concern to baby with the cyst. Told patient no & we will follow up in a few weeks to repeat the ultrasound. Patient verbalized understanding. Told patient Melinda Meyer would be in touch with her for her new OB appt and intake appt. Patient verbalized understanding.

## 2023-02-11 NOTE — Telephone Encounter (Signed)
-----   Message from Venora Maples, MD sent at 02/11/2023  1:28 PM EDT ----- Please let patient know that her formal US puts her due date at September 23, 2023, and that it also confirms the presence of the cysts that I saw on a bedside ultrasound. I discussed with a colleague, and for now the plan will be to order a repeat US when she comes in for her new OB.

## 2023-02-18 ENCOUNTER — Telehealth (INDEPENDENT_AMBULATORY_CARE_PROVIDER_SITE_OTHER): Payer: Managed Care, Other (non HMO)

## 2023-02-18 ENCOUNTER — Inpatient Hospital Stay (HOSPITAL_COMMUNITY)
Admission: AD | Admit: 2023-02-18 | Discharge: 2023-02-18 | Disposition: A | Discharge status: Home / Self Care | Payer: Managed Care, Other (non HMO) | Attending: Obstetrics and Gynecology | Admitting: Obstetrics and Gynecology

## 2023-02-18 ENCOUNTER — Encounter: Payer: Self-pay | Admitting: Family Medicine

## 2023-02-18 DIAGNOSIS — N930 Postcoital and contact bleeding: Secondary | ICD-10-CM

## 2023-02-18 DIAGNOSIS — Z3A09 9 weeks gestation of pregnancy: Secondary | ICD-10-CM | POA: Diagnosis not present

## 2023-02-18 DIAGNOSIS — O26891 Other specified pregnancy related conditions, first trimester: Secondary | ICD-10-CM | POA: Diagnosis not present

## 2023-02-18 DIAGNOSIS — Z3689 Encounter for other specified antenatal screening: Secondary | ICD-10-CM

## 2023-02-18 DIAGNOSIS — O209 Hemorrhage in early pregnancy, unspecified: Secondary | ICD-10-CM | POA: Diagnosis present

## 2023-02-18 DIAGNOSIS — Z3491 Encounter for supervision of normal pregnancy, unspecified, first trimester: Secondary | ICD-10-CM

## 2023-02-18 NOTE — Progress Notes (Signed)
New OB Intake  I connected with Melinda Meyer  on 02/18/23 at  3:15 PM EDT by MyChart Video Visit and verified that I am speaking with the correct person using two identifiers. Nurse is located at [facility] and pt is located at home.  I discussed the limitations, risks, security and privacy concerns of performing an evaluation and management service by telephone and the availability of in person appointments. I also discussed with the patient that there may be a patient responsible charge related to this service. The patient expressed understanding and agreed to proceed.  I explained I am completing New OB Intake today. We discussed EDD of 09/22/22 that is based on [place] on 02/08/23. Pt is G1/P0. I reviewed her allergies, medications, Medical/Surgical/OB history, and appropriate screenings. I informed her of [facility] services. [facility] information placed in AVS. Based on history, this is a low risk pregnancy.  Patient Active Problem List   Diagnosis Date Noted   Supervision of low-risk pregnancy 02/11/2023   Ovarian cyst affecting pregnancy in first trimester, antepartum 02/11/2023   Sprain of anterior talofibular ligament of right ankle 03/20/2020    Concerns addressed today  Delivery Plans Plans to deliver at [facility]. Patient given information for [facility] website for more information [facility]. Patient is not interested in water birth. Offered upcoming OB visit with CNM to discuss further.  MyChart/Babyscripts MyChart access verified. I explained pt will have some visits in office and some virtually. Babyscripts instructions given and order placed. Patient verifies receipt of registration text/e-mail. Account successfully created and app downloaded.  Blood Pressure Cuff/Weight Scale Pt has own BP Cuff, Explained after first prenatal appt pt will check weekly and document in Babyscripts. Patient does have weight scale.  Anatomy US Explained first scheduled [place] will  be around 19 weeks. Anatomy US scheduled for 04/29/23 at 0115p. Pt notified to arrive at 0100p.  Labs Discussed Avelina Laine genetic screening with patient. Would like both Panorama and Horizon drawn at new OB visit. Routine prenatal labs needed.  COVID Vaccine Patient has had COVID vaccine.   Is patient a CenteringPregnancy candidate?  Pt was already with [facility]    patient a Mom+Baby Combined Care candidate?  Already [facility]      Social Determinants of Health Food Insecurity: Patient denies food insecurity. WIC Referral: Patient is interested in referral to [facility].  Transportation: Patient denies transportation needs. Childcare: Discussed no children allowed at ultrasound appointments. Offered childcare services; patient declines childcare services at this time.  Interested in [place]? If yes, send referral and doula dot phrase.   First visit review I reviewed new OB appt with patient. Explained pt will be seen by Dr. Alvester Morin at first visit; encounter routed to appropriate provider. Explained that patient will be seen by pregnancy navigator following visit with provider.   Henrietta Dine, CMA 02/18/2023  3:35 PM

## 2023-02-18 NOTE — MAU Note (Signed)
.  Melinda Meyer is a 27 y.o. at [redacted]w[redacted]d here in MAU reporting: had intercourse last night and has some bleeding after that, but she became concerned this morning because it has continued and was bright red. She mainly sees it when she wipes and has passed a few small clots. Denies pain.   Pain score: 0 Vitals:   02/18/23 0909  BP: 136/78  Pulse: 73  Resp: 14  Temp: 98.2 F (36.8 C)  SpO2: 97%     Lab orders placed from triage:  UA

## 2023-02-18 NOTE — Discharge Instructions (Signed)

## 2023-02-18 NOTE — MAU Provider Note (Signed)
History     CSN: [id number]  Arrival date and time: 02/18/23 0844   Event Date/Time   First Provider Initiated Contact with Patient 02/18/23 [phone]      Chief Complaint  Patient presents with   Vaginal Bleeding   HPI  Melinda Meyer is a 27 y.o. G1P0000 at [redacted]w[redacted]d who presents for evaluation of vaginal bleeding after intercourse. Patient reports she is seeing bright red blood on the tissue when she wipes. She states she is wearing a pad and nothing is getting on the pad but she sees blood every time she wipes. She also feels some irritation when she wipes like a paper cut. She thinks she may have an abrasion from intercourse. She denies any pain.  Denies any constipation, diarrhea or any urinary complaints. She has a known live IUP  OB History     Gravida  1   Para  0   Term  0   Preterm  0   AB  0   Living  0      SAB  0   IAB  0   Ectopic  0   Multiple  0   Live Births  0           No past medical history on file.  Past Surgical History:  Procedure Laterality Date   FOOT SURGERY     TONSILLECTOMY      No family history on file.  Social History   Tobacco Use   Smoking status: Never   Smokeless tobacco: Former  Substance Use Topics   Alcohol use: No   Drug use: No    Allergies: No Known Allergies  Medications Prior to Admission  Medication Sig Dispense Refill Last Dose   Prenatal Vit-Fe Fumarate-FA (MULTIVITAMIN-PRENATAL) 27-0.8 MG TABS tablet Take 1 tablet by mouth daily at 12 noon.       Review of Systems  Constitutional: Negative.  Negative for fatigue and fever.  HENT: Negative.    Respiratory: Negative.  Negative for shortness of breath.   Cardiovascular: Negative.  Negative for chest pain.  Gastrointestinal: Negative.  Negative for abdominal pain, constipation, diarrhea, nausea and vomiting.  Genitourinary:  Positive for vaginal bleeding. Negative for dysuria and vaginal discharge.  Neurological: Negative.  Negative for dizziness  and headaches.   Physical Exam   Blood pressure 136/78, pulse 73, temperature 98.2 F (36.8 C), temperature source Oral, resp. rate 14, height 5\' 3"  (1.6 m), weight 80 kg, SpO2 97 %.  Patient Vitals for the past 24 hrs:  BP Temp Temp src Pulse Resp SpO2 Height Weight  02/18/23 0909 136/78 98.2 F (36.8 C) Oral 73 14 97 % 5\' 3"  (1.6 m) 80 kg    Physical Exam Vitals and nursing note reviewed.  Constitutional:      General: She is not in acute distress.    Appearance: She is well-developed.  HENT:     Head: Normocephalic.  Eyes:     Pupils: Pupils are equal, round, and reactive to light.  Cardiovascular:     Rate and Rhythm: Normal rate and regular rhythm.     Heart sounds: Normal heart sounds.  Pulmonary:     Effort: Pulmonary effort is normal. No respiratory distress.     Breath sounds: Normal breath sounds.  Abdominal:     General: Bowel sounds are normal. There is no distension.     Palpations: Abdomen is soft.     Tenderness: There is no abdominal tenderness.  Skin:  General: Skin is warm and dry.  Neurological:     Mental Status: She is alert and oriented to person, place, and time.  Psychiatric:        Mood and Affect: Mood normal.        Behavior: Behavior normal.        Thought Content: Thought content normal.        Judgment: Judgment normal.      MAU Course  Procedures   MDM  Pt informed that the ultrasound is considered a limited OB ultrasound and is not intended to be a complete ultrasound exam.  Patient also informed that the ultrasound is not being completed with the intent of assessing for fetal or placental anomalies or any pelvic abnormalities.  Explained that the purpose of today's ultrasound is to assess for  viability.  Patient acknowledges the purpose of the exam and the limitations of the study.     Live IUP consistent with dates with FHR 174 bpm  CNM offered pelvic exam and patient declined stating she feels more reassured knowing baby is  ok. Discussed recommendation for pelvic rest and when to return to MAU.  Assessment and Plan   1. Postcoital bleeding   2. [redacted] weeks gestation of pregnancy   3. Normal intrauterine pregnancy on prenatal ultrasound in first trimester     -Discharge home in stable condition -Vaginal bleeding precautions discussed -Patient advised to follow-up with OB as scheduled for prenatal care -Patient may return to MAU as needed or if her condition were to change or worsen  Rolm Bookbinder, CNM 02/18/2023, 9:16 AM

## 2023-03-01 ENCOUNTER — Encounter: Payer: Self-pay | Admitting: Family Medicine

## 2023-03-01 ENCOUNTER — Other Ambulatory Visit: Payer: Self-pay

## 2023-03-01 ENCOUNTER — Inpatient Hospital Stay (HOSPITAL_COMMUNITY)
Admission: AD | Admit: 2023-03-01 | Discharge: 2023-03-01 | Discharge status: Against Medical Advice | Payer: Managed Care, Other (non HMO) | Attending: Obstetrics and Gynecology | Admitting: Obstetrics and Gynecology

## 2023-03-01 DIAGNOSIS — Z3A10 10 weeks gestation of pregnancy: Secondary | ICD-10-CM

## 2023-03-01 DIAGNOSIS — Z5329 Procedure and treatment not carried out because of patient's decision for other reasons: Secondary | ICD-10-CM | POA: Insufficient documentation
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
## 2023-03-01 NOTE — Progress Notes (Signed)
Called 3x times by Dr. Lanae Crumbly.  MD reports pt no longer in lobby and has left prior to being seen.

## 2023-03-10 ENCOUNTER — Other Ambulatory Visit (HOSPITAL_COMMUNITY)
Admission: RE | Admit: 2023-03-10 | Discharge: 2023-03-10 | Disposition: A | Discharge status: Home / Self Care | Payer: Managed Care, Other (non HMO) | Source: Ambulatory Visit | Attending: Family Medicine | Admitting: Family Medicine

## 2023-03-10 ENCOUNTER — Ambulatory Visit: Payer: Managed Care, Other (non HMO) | Admitting: Family Medicine

## 2023-03-10 ENCOUNTER — Other Ambulatory Visit: Payer: Self-pay

## 2023-03-10 ENCOUNTER — Encounter: Payer: Self-pay | Admitting: Family Medicine

## 2023-03-10 VITALS — BP 123/85 | HR 157 | Wt 177.0 lb

## 2023-03-10 DIAGNOSIS — O3481 Maternal care for other abnormalities of pelvic organs, first trimester: Secondary | ICD-10-CM

## 2023-03-10 DIAGNOSIS — Z3491 Encounter for supervision of normal pregnancy, unspecified, first trimester: Secondary | ICD-10-CM | POA: Diagnosis present

## 2023-03-10 DIAGNOSIS — N83209 Unspecified ovarian cyst, unspecified side: Secondary | ICD-10-CM | POA: Diagnosis not present

## 2023-03-10 DIAGNOSIS — Z3A11 11 weeks gestation of pregnancy: Secondary | ICD-10-CM | POA: Diagnosis not present

## 2023-03-10 NOTE — Progress Notes (Signed)
Patient declines amb behavioral health referral today; per patient, she does not see anyone else for anxiety or depression and is not taking anything for these conditions. Maureen Ralphs RN

## 2023-03-10 NOTE — Progress Notes (Signed)
INITIAL PRENATAL VISIT  Subjective:   Melinda Meyer is being seen today for her first obstetrical visit.  This is not a planned pregnancy. This is a desired pregnancy.  She is at [redacted]w[redacted]d gestation by LMP. Her obstetrical history is significant for  none . Relationship with FOB: significant other, living together. Patient does intend to breast feed. Pregnancy history fully reviewed.  Patient reports no complaints.  Indications for ASA therapy (per uptodate) One of the following: Previous pregnancy with preeclampsia, especially early onset and with an adverse outcome No Multifetal gestation No Chronic hypertension No Type 1 or 2 diabetes mellitus No Chronic kidney disease No Autoimmune disease (antiphospholipid syndrome, systemic lupus erythematosus) No  Two or more of the following: Nulliparity Yes Obesity (body mass index >30 kg/m2) No Family history of preeclampsia in mother or sister No Age ?35 years No Sociodemographic characteristics (African American race, low socioeconomic level) No Personal risk factors (eg, previous pregnancy with low birth weight or small for gestational age infant, previous adverse pregnancy outcome [eg, stillbirth], interval >10 years between pregnancies) No  Indications for early GDM screening  First-degree relative with diabetes Yes BMI >30kg/m2 No Age > 25 No Previous birth of an infant weighing ?4000 g No Gestational diabetes mellitus in a previous pregnancy No Glycated hemoglobin ?5.7 percent (39 mmol/mol), impaired glucose tolerance, or impaired fasting glucose on previous testing No High-risk race/ethnicity (eg, African American, Latino, Native American, Asian American, Pacific Islander) Yes Previous stillbirth of unknown cause No Maternal birthweight > 9 lbs No History of cardiovascular disease No Hypertension or on therapy for hypertension No High-density lipoprotein cholesterol level <35 mg/dL (5.40 mmol/L) and/or a triglyceride level >250  mg/dL (9.81 mmol/L) No Polycystic ovary syndrome No Physical inactivity No Other clinical condition associated with insulin resistance (eg, severe obesity, acanthosis nigricans) No Current use of glucocorticoids No   Early screening tests: FBS, A1C, Random CBG, glucose challenge   Review of Systems:   Review of Systems  Objective:    Obstetric History OB History  Gravida Para Term Preterm AB Living  1 0 0 0 0 0  SAB IAB Ectopic Multiple Live Births  0 0 0 0 0    # Outcome Date GA Lbr Len/2nd Weight Sex Delivery Anes PTL Lv  1 Current             History reviewed. No pertinent past medical history.  Past Surgical History:  Procedure Laterality Date   FOOT SURGERY     TONSILLECTOMY      Current Outpatient Medications on File Prior to Visit  Medication Sig Dispense Refill   Prenatal Vit-Fe Fumarate-FA (MULTIVITAMIN-PRENATAL) 27-0.8 MG TABS tablet Take 1 tablet by mouth daily at 12 noon.     No current facility-administered medications on file prior to visit.    No Known Allergies  Social History:  reports that she has never smoked. She has never used smokeless tobacco. She reports that she does not drink alcohol and does not use drugs.  History reviewed. No pertinent family history.  The following portions of the patient's history were reviewed and updated as appropriate: allergies, current medications, past family history, past medical history, past social history, past surgical history and problem list.  Review of Systems Review of Systems    Physical Exam:  BP 123/85   Pulse (!) 157   Wt 177 lb (80.3 kg)   BMI 27.31 kg/m  CONSTITUTIONAL: Well-developed, well-nourished female in no acute distress.  HENT:  Normocephalic, atraumatic.  Oropharynx is clear and moist EYES: Conjunctivae normal. No scleral icterus.  NECK: Normal range of motion, supple, no masses.  Normal thyroid.  SKIN: Skin is warm and dry. No rash noted. Not diaphoretic. No erythema. No  pallor. MUSCULOSKELETAL: Normal range of motion. No tenderness.  No cyanosis, clubbing, or edema.   NEUROLOGIC: Alert and oriented to person, place, and time. Normal muscle tone coordination.  PSYCHIATRIC: Normal mood and affect. Normal behavior. Normal judgment and thought content. CARDIOVASCULAR: Normal heart rate noted, regular rhythm RESPIRATORY: Clear to auscultation bilaterally. Effort and breath sounds normal, no problems with respiration noted. BREASTS: Symmetric in size. No masses, skin changes, nipple drainage, or lymphadenopathy. ABDOMEN: Soft, normal bowel sounds, no distention noted.  No tenderness, rebound or guarding. Fundal ht: below pelvic rim PELVIC: Normal appearing external genitalia; normal appearing vaginal mucosa and cervix.  No abnormal discharge noted.  Pap smear obtained.  Normal uterine size, no other palpable masses, no uterine or adnexal tenderness.  Fetal Heart Rate (bpm): 151   Movement: Absent       Assessment:    Pregnancy: G1P0000  1. Encounter for supervision of low-risk pregnancy in first trimester - US OB Comp Less 14 Wks; Future - CBC/D/Plt+RPR+Rh+ABO+RubIgG... - Hemoglobin A1c - Culture, OB Urine - Cytology - PAP( [place]) - [place] Basic Panel - PANORAMA PRENATAL TEST  2. Ovarian cyst affecting pregnancy in first trimester, antepartum Complex ovarian cyst seen on early [place] Large cyst 8x6x5 with internal septations. 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  - Follow up needed, ordered US - US OB Comp Less 14 Wks; Future     Plan:     Initial labs drawn. Prenatal vitamins. Problem list reviewed and updated. Reviewed in detail the nature of the practice with collaborative care between  Genetic screening discussed: NIPS ordered. Role of ultrasound in pregnancy discussed; Anatomy US: ordered. Amniocentesis discussed: not indicated. Follow up in 4 weeks. Discussed clinic routines, schedule of care and testing, genetic  screening options, involvement of students and residents under the direct supervision of APPs and doctors and presence of female providers. Pt verbalized understanding. Discussed nature of [facility] care   Return in about 4 weeks (around 04/07/2023) for Routine prenatal care, Mom+Baby Combined Care.  Future Appointments  Date Time Provider Department Center  03/18/2023  2:30 PM [facility] 1 [facility]  04/08/2023  1:15 PM Venora Maples, MD [facility]  04/29/2023  1:15 PM [facility] NURSE [facility]  04/29/2023  1:30 PM [facility] US3 [facility]    Federico Flake, MD 03/12/2023 8:48 AM

## 2023-03-11 ENCOUNTER — Encounter: Payer: Self-pay | Admitting: Family Medicine

## 2023-03-11 ENCOUNTER — Encounter: Payer: Self-pay | Admitting: General Practice

## 2023-03-11 LAB — CBC/D/PLT+RPR+RH+ABO+RUBIGG...
Antibody Screen: NEGATIVE
Basophils Absolute: 0 10*3/uL (ref 0.0–0.2)
Basos: 0 %
EOS (ABSOLUTE): 0 10*3/uL (ref 0.0–0.4)
Eos: 0 %
HCV Ab: NONREACTIVE
HIV Screen 4th Generation wRfx: NONREACTIVE
Hematocrit: 39.7 % (ref 34.0–46.6)
Hemoglobin: 12.6 g/dL (ref 11.1–15.9)
Hepatitis B Surface Ag: NEGATIVE
Immature Grans (Abs): 0 10*3/uL (ref 0.0–0.1)
Immature Granulocytes: 0 %
Lymphocytes Absolute: 2.8 10*3/uL (ref 0.7–3.1)
Lymphs: 23 %
MCH: 28.1 pg (ref 26.6–33.0)
MCHC: 31.7 g/dL (ref 31.5–35.7)
MCV: 89 fL (ref 79–97)
Monocytes Absolute: 0.8 10*3/uL (ref 0.1–0.9)
Monocytes: 7 %
Neutrophils Absolute: 8.5 10*3/uL — ABNORMAL HIGH (ref 1.4–7.0)
Neutrophils: 70 %
Platelets: 263 10*3/uL (ref 150–450)
RBC: 4.48 x10E6/uL (ref 3.77–5.28)
RDW: 13.1 % (ref 11.7–15.4)
RPR Ser Ql: NONREACTIVE
Rh Factor: POSITIVE
Rubella Antibodies, IGG: 3.05 index (ref >0.99)
WBC: 12.2 10*3/uL — ABNORMAL HIGH (ref 3.4–10.8)

## 2023-03-11 LAB — HEMOGLOBIN A1C
Est. average glucose Bld gHb Est-mCnc: 108 mg/dL
Hgb A1c MFr Bld: 5.4 % (ref 4.8–5.6)

## 2023-03-11 LAB — HCV INTERPRETATION

## 2023-03-12 ENCOUNTER — Encounter: Payer: Self-pay | Admitting: *Deleted

## 2023-03-12 LAB — URINE CULTURE, OB REFLEX: Organism ID, Bacteria: NO GROWTH

## 2023-03-12 LAB — CULTURE, OB URINE

## 2023-03-15 LAB — CYTOLOGY - PAP
Chlamydia: NEGATIVE
Comment: NEGATIVE
Comment: NORMAL
Neisseria Gonorrhea: NEGATIVE

## 2023-03-17 ENCOUNTER — Encounter: Payer: Self-pay | Admitting: Family Medicine

## 2023-03-18 ENCOUNTER — Ambulatory Visit (HOSPITAL_COMMUNITY)
Admission: RE | Admit: 2023-03-18 | Discharge: 2023-03-18 | Disposition: A | Discharge status: Home / Self Care | Payer: Managed Care, Other (non HMO) | Source: Ambulatory Visit | Attending: Family Medicine | Admitting: Family Medicine

## 2023-03-18 DIAGNOSIS — N83209 Unspecified ovarian cyst, unspecified side: Secondary | ICD-10-CM | POA: Diagnosis not present

## 2023-03-18 DIAGNOSIS — O3481 Maternal care for other abnormalities of pelvic organs, first trimester: Secondary | ICD-10-CM | POA: Insufficient documentation

## 2023-03-18 DIAGNOSIS — Z3491 Encounter for supervision of normal pregnancy, unspecified, first trimester: Secondary | ICD-10-CM | POA: Insufficient documentation

## 2023-03-18 DIAGNOSIS — Z3A12 12 weeks gestation of pregnancy: Secondary | ICD-10-CM | POA: Diagnosis not present

## 2023-03-19 LAB — PANORAMA PRENATAL TEST FULL PANEL:PANORAMA TEST PLUS 5 ADDITIONAL MICRODELETIONS: FETAL FRACTION: 4.2

## 2023-03-29 ENCOUNTER — Encounter: Payer: Self-pay | Admitting: Family Medicine

## 2023-04-08 ENCOUNTER — Encounter: Payer: Self-pay | Admitting: Family Medicine

## 2023-04-08 ENCOUNTER — Ambulatory Visit (INDEPENDENT_AMBULATORY_CARE_PROVIDER_SITE_OTHER): Payer: Managed Care, Other (non HMO) | Admitting: Family Medicine

## 2023-04-08 ENCOUNTER — Other Ambulatory Visit: Payer: Self-pay

## 2023-04-08 VITALS — BP 133/79 | HR 92 | Wt 179.6 lb

## 2023-04-08 DIAGNOSIS — O3481 Maternal care for other abnormalities of pelvic organs, first trimester: Secondary | ICD-10-CM

## 2023-04-08 DIAGNOSIS — O219 Vomiting of pregnancy, unspecified: Secondary | ICD-10-CM

## 2023-04-08 DIAGNOSIS — Z3492 Encounter for supervision of normal pregnancy, unspecified, second trimester: Secondary | ICD-10-CM

## 2023-04-08 DIAGNOSIS — N83209 Unspecified ovarian cyst, unspecified side: Secondary | ICD-10-CM

## 2023-04-08 DIAGNOSIS — Z3A16 16 weeks gestation of pregnancy: Secondary | ICD-10-CM

## 2023-04-08 MED ORDER — ONDANSETRON 4 MG PO TBDP
4.0000 mg | ORAL_TABLET | Freq: Four times a day (QID) | ORAL | 5 refills | Status: DC | PRN
Start: 2023-04-08 — End: 2023-08-02

## 2023-04-08 NOTE — Progress Notes (Signed)
   Subjective:  Melinda Meyer is a 27 y.o. G1P0000 at [redacted]w[redacted]d being seen today for ongoing prenatal care.  She is currently monitored for the following issues for this low-risk pregnancy and has Sprain of anterior talofibular ligament of right ankle; Supervision of low-risk pregnancy; and Ovarian cyst affecting pregnancy in first trimester, antepartum on their problem list.  Patient reports no complaints.  Contractions: Not present. Vag. Bleeding: None.  Movement: Absent. Denies leaking of fluid.   The following portions of the patient's history were reviewed and updated as appropriate: allergies, current medications, past family history, past medical history, past social history, past surgical history and problem list. Problem list updated.  Objective:   Vitals:   04/08/23 1324  BP: 133/79  Pulse: 92  Weight: 179 lb 9.6 oz (81.5 kg)    Fetal Status: Fetal Heart Rate (bpm): 138   Movement: Absent     General:  Alert, oriented and cooperative. Patient is in no acute distress.  Skin: Skin is warm and dry. No rash noted.   Cardiovascular: Normal heart rate noted  Respiratory: Normal respiratory effort, no problems with respiration noted  Abdomen: Soft, gravid, appropriate for gestational age. Pain/Pressure: Absent     Pelvic: Vag. Bleeding: None     Cervical exam deferred        Extremities: Normal range of motion.     Mental Status: Normal mood and affect. Normal behavior. Normal judgment and thought content.   Urinalysis:      Assessment and Plan:  Pregnancy: G1P0000 at [redacted]w[redacted]d  1. Encounter for supervision of low-risk pregnancy in second trimester BP and FHR normal AFP offered, accepts - AFP, Serum, Open Spina Bifida  2. Ovarian cyst affecting pregnancy in first trimester, antepartum Reviewed images, decrease in interval size and read favors hemorrhagic cysts Follow up images from anatomy scan in a few weeks  3. Nausea and vomiting during pregnancy Rx sent for zofran -  ondansetron (ZOFRAN-ODT) 4 MG disintegrating tablet; Take 1 tablet (4 mg total) by mouth every 6 (six) hours as needed for nausea.  Dispense: 20 tablet; Refill: 5  Preterm labor symptoms and general obstetric precautions including but not limited to vaginal bleeding, contractions, leaking of fluid and fetal movement were reviewed in detail with the patient. Please refer to After Visit Summary for other counseling recommendations.  Return in 4 weeks (on 05/06/2023) for Dyad patient, ob visit.   Venora Maples, MD

## 2023-04-08 NOTE — Patient Instructions (Signed)

## 2023-04-12 LAB — AFP, SERUM, OPEN SPINA BIFIDA
AFP MoM: 1.02
AFP Value: 30.6 ng/mL
Gest. Age on Collection Date: 16 weeks
Maternal Age At EDD: 25 yr
OSBR Risk 1 IN: 10000
Test Results:: NEGATIVE
Weight: 179 [lb_av]

## 2023-04-29 ENCOUNTER — Ambulatory Visit: Payer: Managed Care, Other (non HMO)

## 2023-04-29 ENCOUNTER — Ambulatory Visit: Payer: Managed Care, Other (non HMO) | Attending: Family Medicine

## 2023-04-29 VITALS — BP 137/74 | HR 109

## 2023-04-29 DIAGNOSIS — Z363 Encounter for antenatal screening for malformations: Secondary | ICD-10-CM | POA: Diagnosis not present

## 2023-04-29 DIAGNOSIS — Z3689 Encounter for other specified antenatal screening: Secondary | ICD-10-CM | POA: Diagnosis not present

## 2023-04-29 DIAGNOSIS — Z3491 Encounter for supervision of normal pregnancy, unspecified, first trimester: Secondary | ICD-10-CM | POA: Insufficient documentation

## 2023-04-29 DIAGNOSIS — O3480 Maternal care for other abnormalities of pelvic organs, unspecified trimester: Secondary | ICD-10-CM | POA: Insufficient documentation

## 2023-04-29 DIAGNOSIS — Z3A18 18 weeks gestation of pregnancy: Secondary | ICD-10-CM | POA: Insufficient documentation

## 2023-04-29 DIAGNOSIS — N83209 Unspecified ovarian cyst, unspecified side: Secondary | ICD-10-CM | POA: Insufficient documentation

## 2023-04-30 ENCOUNTER — Other Ambulatory Visit: Payer: Self-pay | Admitting: *Deleted

## 2023-04-30 DIAGNOSIS — Z362 Encounter for other antenatal screening follow-up: Secondary | ICD-10-CM

## 2023-05-03 ENCOUNTER — Encounter: Payer: Self-pay | Admitting: Family Medicine

## 2023-05-05 ENCOUNTER — Encounter: Payer: Managed Care, Other (non HMO) | Admitting: Family Medicine

## 2023-05-05 ENCOUNTER — Other Ambulatory Visit: Payer: Self-pay

## 2023-05-05 ENCOUNTER — Ambulatory Visit (INDEPENDENT_AMBULATORY_CARE_PROVIDER_SITE_OTHER): Payer: Managed Care, Other (non HMO) | Admitting: Certified Nurse Midwife

## 2023-05-05 VITALS — BP 125/63 | HR 72 | Wt 183.0 lb

## 2023-05-05 DIAGNOSIS — Z3A19 19 weeks gestation of pregnancy: Secondary | ICD-10-CM

## 2023-05-05 DIAGNOSIS — Z3492 Encounter for supervision of normal pregnancy, unspecified, second trimester: Secondary | ICD-10-CM

## 2023-05-05 NOTE — Progress Notes (Signed)
   PRENATAL VISIT NOTE  Subjective:  Melinda Meyer is a 27 y.o. G1P0000 at [redacted]w[redacted]d being seen today for ongoing prenatal care.  She is currently monitored for the following issues for this low-risk pregnancy and has Supervision of low-risk pregnancy and Ovarian cyst affecting pregnancy in first trimester, antepartum on their problem list.  Patient reports no complaints.  Contractions: Not present. Vag. Bleeding: None.  Movement: Present. Denies leaking of fluid.   The following portions of the patient's history were reviewed and updated as appropriate: allergies, current medications, past family history, past medical history, past social history, past surgical history and problem list.   Objective:   Vitals:   05/05/23 1658  BP: 125/63  Pulse: 72  Weight: 183 lb (83 kg)    Fetal Status: Fetal Heart Rate (bpm): 135   Movement: Present     General:  Alert, oriented and cooperative. Patient is in no acute distress.  Skin: Skin is warm and dry. No rash noted.   Cardiovascular: Normal heart rate noted  Respiratory: Normal respiratory effort, no problems with respiration noted  Abdomen: Soft, gravid, appropriate for gestational age.  Pain/Pressure: Absent     Pelvic: Cervical exam deferred        Extremities: Normal range of motion.  Edema: None  Mental Status: Normal mood and affect. Normal behavior. Normal judgment and thought content.   Assessment and Plan:  Pregnancy: G1P0000 at [redacted]w[redacted]d 1. Encounter for supervision of low-risk pregnancy in second trimester - Doing well, feeling regular fetal movement   2. [redacted] weeks gestation of pregnancy - Routine OB care   Preterm labor symptoms and general obstetric precautions including but not limited to vaginal bleeding, contractions, leaking of fluid and fetal movement were reviewed in detail with the patient. Please refer to After Visit Summary for other counseling recommendations.   Return in about 4 weeks (around 06/02/2023) for IN-PERSON,  LOB.  Future Appointments  Date Time Provider Department Center  05/28/2023  1:30 PM [facility] US3 [facility]   Bernerd Limbo, CNM

## 2023-05-13 ENCOUNTER — Encounter: Payer: Self-pay | Admitting: Family Medicine

## 2023-05-14 ENCOUNTER — Encounter: Payer: Self-pay | Admitting: Family Medicine

## 2023-05-15 ENCOUNTER — Encounter (HOSPITAL_COMMUNITY): Payer: Self-pay | Admitting: Obstetrics and Gynecology

## 2023-05-15 ENCOUNTER — Inpatient Hospital Stay (EMERGENCY_DEPARTMENT_HOSPITAL)
Admission: AD | Admit: 2023-05-15 | Discharge: 2023-05-15 | Disposition: A | Discharge status: Home / Self Care | Payer: Managed Care, Other (non HMO) | Source: Home / Self Care | Attending: Obstetrics and Gynecology | Admitting: Obstetrics and Gynecology

## 2023-05-15 ENCOUNTER — Other Ambulatory Visit: Payer: Self-pay

## 2023-05-15 ENCOUNTER — Encounter (HOSPITAL_BASED_OUTPATIENT_CLINIC_OR_DEPARTMENT_OTHER): Payer: Self-pay

## 2023-05-15 ENCOUNTER — Emergency Department (HOSPITAL_BASED_OUTPATIENT_CLINIC_OR_DEPARTMENT_OTHER)
Admission: EM | Admit: 2023-05-15 | Discharge: 2023-05-15 | Disposition: A | Discharge status: Home / Self Care | Payer: Managed Care, Other (non HMO) | Attending: Emergency Medicine | Admitting: Emergency Medicine

## 2023-05-15 DIAGNOSIS — O26892 Other specified pregnancy related conditions, second trimester: Secondary | ICD-10-CM | POA: Insufficient documentation

## 2023-05-15 DIAGNOSIS — N949 Unspecified condition associated with female genital organs and menstrual cycle: Secondary | ICD-10-CM | POA: Diagnosis not present

## 2023-05-15 DIAGNOSIS — Z3A21 21 weeks gestation of pregnancy: Secondary | ICD-10-CM | POA: Insufficient documentation

## 2023-05-15 DIAGNOSIS — R262 Difficulty in walking, not elsewhere classified: Secondary | ICD-10-CM | POA: Insufficient documentation

## 2023-05-15 DIAGNOSIS — O26899 Other specified pregnancy related conditions, unspecified trimester: Secondary | ICD-10-CM

## 2023-05-15 DIAGNOSIS — R102 Pelvic and perineal pain: Secondary | ICD-10-CM | POA: Insufficient documentation

## 2023-05-15 LAB — URINALYSIS, ROUTINE W REFLEX MICROSCOPIC
Bilirubin Urine: NEGATIVE
Bilirubin Urine: NEGATIVE
Glucose, UA: NEGATIVE mg/dL
Glucose, UA: NEGATIVE mg/dL
Hgb urine dipstick: NEGATIVE
Hgb urine dipstick: NEGATIVE
Ketones, ur: NEGATIVE mg/dL
Ketones, ur: NEGATIVE mg/dL
Leukocytes,Ua: NEGATIVE
Nitrite: NEGATIVE
Nitrite: NEGATIVE
Protein, ur: NEGATIVE mg/dL
Protein, ur: NEGATIVE mg/dL
Specific Gravity, Urine: 1.025 (ref 1.005–1.030)
Specific Gravity, Urine: 1.012 (ref 1.005–1.030)
pH: 7 (ref 5.0–8.0)
pH: 7 (ref 5.0–8.0)

## 2023-05-15 MED ORDER — CYCLOBENZAPRINE HCL 10 MG PO TABS: 10.0000 mg | ORAL_TABLET | Freq: Two times a day (BID) | ORAL | 0 refills | Status: DC | PRN

## 2023-05-15 MED ORDER — CYCLOBENZAPRINE HCL 5 MG PO TABS
10.0000 mg | ORAL_TABLET | Freq: Once | ORAL | Status: AC
  Administered 2023-05-15: 10 mg via ORAL
  Filled 2023-05-15: qty 2

## 2023-05-15 NOTE — ED Provider Notes (Signed)
[place] [facility] Provider Note   CSN: [id number] Arrival date & time: 05/15/23  1254     History  Chief Complaint  Patient presents with   Pelvic Pain    Melinda Meyer is a 27 y.o. female currently [redacted] weeks pregnant presented with pelvic discomfort for 3 days.  Patient states that the discomfort comes and goes and that if she holds her belly the discomfort gets better.  Patient denies any dysuria, fevers, nauseous vomiting, headaches, elevated blood pressure, vaginal bleeding, vaginal discharge, hematuria, changes sensation/motor skills.  Patient states that her pregnancy thus far has been uncomplicated.  Patient denies chest pain, shortness of breath, fevers seizure-like activity, vaginal discharge/leakage, dysuria, hematuria  OB/GYN: Dan Humphreys, CNM  Home Medications Prior to Admission medications   Medication Sig Start Date End Date Taking? Authorizing Provider  Prenatal Vit-Fe Fumarate-FA (MULTIVITAMIN-PRENATAL) 27-0.8 MG TABS tablet Take 1 tablet by mouth daily at 12 noon.   Yes [provider]  ondansetron (ZOFRAN-ODT) 4 MG disintegrating tablet Take 1 tablet (4 mg total) by mouth every 6 (six) hours as needed for nausea. Patient not taking: Reported on 04/29/2023 04/08/23   Venora Maples, MD      Allergies    Patient has no known allergies.    Review of Systems   Review of Systems  Genitourinary:  Positive for pelvic pain.    Physical Exam Updated Vital Signs BP 139/78   Pulse 98   Temp 98.3 F (36.8 C)   Resp 18   Ht 5\' 7"  (1.702 m)   Wt 83 kg   LMP  (LMP Unknown)   SpO2 97%   BMI 28.66 kg/m  Physical Exam Constitutional:      General: She is not in acute distress.    Comments: Appearing comfortable On phone  Cardiovascular:     Rate and Rhythm: Normal rate and regular rhythm.     Pulses: Normal pulses.     Heart sounds: Normal heart sounds.  Pulmonary:     Effort: Pulmonary effort is normal.      Breath sounds: Normal breath sounds.  Abdominal:     Palpations: Abdomen is soft.     Tenderness: There is no abdominal tenderness. There is no guarding or rebound.     Comments: Appears pregnant  Skin:    General: Skin is warm and dry.     Comments: No overlying skin color changes  Neurological:     Mental Status: She is alert.  Psychiatric:        Mood and Affect: Mood normal.     ED Results / Procedures / Treatments   Labs (all labs ordered are listed, but only abnormal results are displayed) Labs Reviewed  URINALYSIS, ROUTINE W REFLEX MICROSCOPIC    EKG None  Radiology No results found.  Procedures Procedures    Medications Ordered in ED Medications - No data to display  ED Course/ Medical Decision Making/ A&P                                 Medical Decision Making Amount and/or Complexity of Data Reviewed Labs: ordered.   Cathlyn Parsons 27 y.o. presented today for pelvic discomfort with [redacted] weeks pregnant. Working DDx that I considered at this time includes, but not limited to, normal variant of pregnancy, asymptomatic bacteriuria, miscarriage, preterm labor, vasa previa, symptoms previa, uterine rupture, hemorrhagic ovarian cyst, ovarian torsion,  chorioamnionitis, preeclampsia/eclampsia, HELLP syndrome.  R/o DDx: asymptomatic bacteriuria, miscarriage, preterm labor, vasa previa, symptoms previa, uterine rupture, hemorrhagic ovarian cyst, ovarian torsion, chorioamnionitis, preeclampsia/eclampsia, HELLP syndrome: These are considered less likely due to history of present illness and physical exam findings  Review of prior external notes: 04/29/2023 clinical support  Unique Tests and My Interpretation:  UA: Negative Fetal Doppler: 137 bpm  Discussion with Independent Historian:  Friend  Discussion of Management of Tests: None  Risk: Low: based on diagnostic testing/clinical impression and treatment plan  Risk Stratification Score: None  Staffed with  Belfi, MD  Plan: On exam patient was in no acute distress with stable vitals.  Patient's physical exam was overall unremarkable.  Doppler was reassuring.  Patient was not endorsing any red flag symptoms and so have high suspicion this is a normal variant of pregnancy however UA will be ordered to rule out asymptomatic bacteria.  Patient not endorsing any vaginal bleeding, discharge, leakage and so ultrasound not indicated at this time.  Patient not requiring medications at this time.  Urinalysis was negative for UTI.  I spoke to the attending and with patient's reassuring history physical exam and labs and not describing the intermittent discomfort as cramps we recommend following up with her OB/GYN as she is established in the outpatient setting and to go to the MAU if symptoms are change or worsen.  On recheck patient still appeared comfortable.  This plan was verbalized to the patient and patient verbalized understanding acceptance of this plan.  Patient was given return precautions. Patient stable for discharge at this time.  Patient verbalized understanding of plan.         Final Clinical Impression(s) / ED Diagnoses Final diagnoses:  Pelvic pain in pregnancy    Rx / DC Orders ED Discharge Orders     None         Remi Deter 05/15/23 1423    Rolan Bucco, MD 05/16/23 [phone]

## 2023-05-15 NOTE — Discharge Instructions (Signed)
Comfort Measures for Round Ligament Pain:  Soak in a warm tub with 1/2 to 1 cup of Epsom salt in it Tylenol 1000 mg by mouth every 6-8 hrs as needed for pain (or Flexeril 10 mg by mouth every 12 hours as needed) Slow position changes Do pelvic massages and chair stretches (as demonstrated to you) Laying on the affected side

## 2023-05-15 NOTE — MAU Note (Signed)
.  Melinda Meyer is a 27 y.o. at [redacted]w[redacted]d here in MAU reporting: lower abd pain that started 3 days ago but worsened today. States the pain is constant. Denies bleeding, dysuria, ROM.  Onset of complaint: 3 days Pain score: 8/10 Vitals:   05/15/23 1505  BP: (!) 151/81  Pulse: 97  Resp: 18  Temp: 99.1 F (37.3 C)  SpO2: 100%     Lab orders placed from triage:  ua

## 2023-05-15 NOTE — MAU Provider Note (Signed)
History     CSN: [id number]  Arrival date and time: 05/15/23 1449   Event Date/Time   First Provider Initiated Contact with Patient 05/15/23 1525      Chief Complaint  Patient presents with   Abdominal Pain   HPI [name] is a 27 y.o. year old G39P0000 female at [redacted]w[redacted]d weeks gestation who presents to MAU reporting lower abdominal pain since Wednesday that has progressively gotten worse. She reports that "any type of movement makes it worse." She describes the pain as constant. She reports the pain is "so bad that it has me doubled over in pain and unable to walk." Her sister states, "The pain is even so bad that every bump in the road she could feel." She receives [facility] with [facility]; next appt is 05/17/2023. Her sister is present and contributing to the history taking.   OB History     Gravida  1   Para  0   Term  0   Preterm  0   AB  0   Living  0      SAB  0   IAB  0   Ectopic  0   Multiple  0   Live Births  0           History reviewed. No pertinent past medical history.  Past Surgical History:  Procedure Laterality Date   FOOT SURGERY     TONSILLECTOMY      Family History  Problem Relation Age of Onset   Asthma Mother    Diabetes Father    Depression Paternal Grandmother    Cancer Neg Hx    Heart disease Neg Hx    Hypertension Neg Hx     Social History   Tobacco Use   Smoking status: Never   Smokeless tobacco: Never  Vaping Use   Vaping status: Former   Quit date: 09/21/2022   Substances: Nicotine, Flavoring  Substance Use Topics   Alcohol use: No   Drug use: No    Allergies: No Known Allergies  Medications Prior to Admission  Medication Sig Dispense Refill Last Dose   Prenatal Vit-Fe Fumarate-FA (MULTIVITAMIN-PRENATAL) 27-0.8 MG TABS tablet Take 1 tablet by mouth daily at 12 noon.   05/14/2023   ondansetron (ZOFRAN-ODT) 4 MG disintegrating tablet Take 1 tablet (4 mg total) by mouth every 6 (six) hours as needed for nausea.  (Patient not taking: Reported on 04/29/2023) 20 tablet 5     Review of Systems  Constitutional: Negative.   HENT: Negative.    Eyes: Negative.   Respiratory: Negative.    Cardiovascular: Negative.   Gastrointestinal: Negative.   Endocrine: Negative.   Genitourinary:  Positive for pelvic pain.  Musculoskeletal: Negative.   Skin: Negative.   Allergic/Immunologic: Negative.   Neurological: Negative.   Hematological: Negative.   Psychiatric/Behavioral: Negative.     Physical Exam   Blood pressure 125/62, pulse 81, temperature 99.1 F (37.3 C), resp. rate 18, height 5' 7.5" (1.715 m), weight 82.6 kg, SpO2 100%.  Physical Exam Vitals and nursing note reviewed.  Constitutional:      Appearance: Normal appearance. She is normal weight.  Cardiovascular:     Rate and Rhythm: Normal rate.  Pulmonary:     Effort: Pulmonary effort is normal.  Abdominal:     General: Abdomen is flat.     Palpations: Abdomen is soft.     Tenderness: There is abdominal tenderness (lower LT area of pelvis).  Musculoskeletal:  General: Normal range of motion.  Skin:    General: Skin is warm and dry.  Neurological:     Mental Status: She is alert and oriented to person, place, and time.  Psychiatric:        Mood and Affect: Mood normal.        Behavior: Behavior normal.        Thought Content: Thought content normal.        Judgment: Judgment normal.    FHTs by doppler: 142 bpm   Reassessment @ 1645: Patient states, "I feel so much better. 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 MAU Course  Procedures  MDM CCUA Results for orders placed or performed during the hospital encounter of 05/15/23 (from the past 24 hour(s))  Urinalysis, Routine w reflex microscopic -Urine, Clean Catch     Status: Abnormal   Collection Time: 05/15/23  3:03 PM  Result Value Ref Range   Color, Urine YELLOW YELLOW   APPearance HAZY (A) CLEAR   Specific Gravity, Urine 1.012 1.005 - 1.030   pH 7.0  5.0 - 8.0   Glucose, UA NEGATIVE NEGATIVE mg/dL   Hgb urine dipstick NEGATIVE NEGATIVE   Bilirubin Urine NEGATIVE NEGATIVE   Ketones, ur NEGATIVE NEGATIVE mg/dL   Protein, ur NEGATIVE NEGATIVE mg/dL   Nitrite NEGATIVE NEGATIVE   Leukocytes,Ua SMALL (A) NEGATIVE   RBC / HPF 0-5 0 - 5 RBC/hpf   WBC, UA 0-5 0 - 5 WBC/hpf   Bacteria, UA RARE (A) NONE SEEN   Squamous Epithelial / HPF 0-5 0 - 5 /HPF   Mucus PRESENT    Amorphous Crystal PRESENT     Assessment and Plan  1. Round ligament pain - Information provided on RLP and comfort measures  - Rx: Flexeril 10 mg po BID prn  2. [redacted] weeks gestation of pregnancy   - Discharge patient - Keep scheduled appt with [facility] on 05/17/2023 - Patient verbalized an understanding of the plan of care and agrees.   Raelyn Mora, CNM 05/15/2023, 3:25 PM

## 2023-05-15 NOTE — Discharge Instructions (Addendum)
Please follow-up with your OB/GYN regarding recent symptoms and ER visit.  Today your urine was negative and your physical exam and history are reassuring however symptoms may change.  If symptoms change or worsen please go to the MAU at [facility] is they have more resources for pregnant patients.

## 2023-05-15 NOTE — ED Triage Notes (Addendum)
Pt reports pelvic pain that began Wednesday. Pt is [redacted] weeks pregnant. Denies vaginal discharge. Pain is intermittent and worse today. No urinary symptoms. Has hx of ovarian cyst, but not seen in last [place]

## 2023-05-17 ENCOUNTER — Other Ambulatory Visit: Payer: Self-pay

## 2023-05-17 ENCOUNTER — Other Ambulatory Visit (HOSPITAL_COMMUNITY)
Admission: RE | Admit: 2023-05-17 | Discharge: 2023-05-17 | Disposition: A | Discharge status: Home / Self Care | Payer: Managed Care, Other (non HMO) | Source: Ambulatory Visit | Attending: Family Medicine | Admitting: Family Medicine

## 2023-05-17 ENCOUNTER — Ambulatory Visit (INDEPENDENT_AMBULATORY_CARE_PROVIDER_SITE_OTHER): Payer: Managed Care, Other (non HMO) | Admitting: *Deleted

## 2023-05-17 VITALS — BP 122/66 | HR 91 | Wt 184.1 lb

## 2023-05-17 DIAGNOSIS — Z3490 Encounter for supervision of normal pregnancy, unspecified, unspecified trimester: Secondary | ICD-10-CM | POA: Insufficient documentation

## 2023-05-17 DIAGNOSIS — O26899 Other specified pregnancy related conditions, unspecified trimester: Secondary | ICD-10-CM | POA: Insufficient documentation

## 2023-05-17 DIAGNOSIS — N898 Other specified noninflammatory disorders of vagina: Secondary | ICD-10-CM | POA: Diagnosis present

## 2023-05-17 DIAGNOSIS — O26892 Other specified pregnancy related conditions, second trimester: Secondary | ICD-10-CM

## 2023-05-17 DIAGNOSIS — Z3A21 21 weeks gestation of pregnancy: Secondary | ICD-10-CM

## 2023-05-17 NOTE — Progress Notes (Signed)
Came in for a self swab for c/o yellowish / white discharge for a few day. Denies any other symptoms. Self swab completed and informed will be notiified if anything positive and needs treatment. Nancy Fetter

## 2023-05-19 ENCOUNTER — Telehealth: Payer: Self-pay

## 2023-05-19 LAB — CERVICOVAGINAL ANCILLARY ONLY
Bacterial Vaginitis (gardnerella): NEGATIVE
Candida Glabrata: NEGATIVE
Candida Vaginitis: POSITIVE — AB
Chlamydia: NEGATIVE
Comment: NEGATIVE
Comment: NEGATIVE
Comment: NEGATIVE
Comment: NEGATIVE
Comment: NEGATIVE
Comment: NORMAL
Neisseria Gonorrhea: NEGATIVE
Trichomonas: NEGATIVE

## 2023-05-19 MED ORDER — TERCONAZOLE 0.4 % VA CREA: 1.0000 | TOPICAL_CREAM | Freq: Every day | VAGINAL | 0 refills | Status: DC

## 2023-05-19 NOTE — Telephone Encounter (Addendum)
-----   Message from Warden Fillers sent at 05/19/2023 11:43 AM EDT ----- Yeast noted on swab, will offer treatment  Pt notified of results.  Terazol e-prescribed per standing protocol.    Leonette Nutting  05/19/23

## 2023-05-28 ENCOUNTER — Ambulatory Visit: Payer: Managed Care, Other (non HMO) | Attending: Obstetrics and Gynecology

## 2023-05-28 DIAGNOSIS — Z3A22 22 weeks gestation of pregnancy: Secondary | ICD-10-CM

## 2023-05-28 DIAGNOSIS — O358XX0 Maternal care for other (suspected) fetal abnormality and damage, not applicable or unspecified: Secondary | ICD-10-CM

## 2023-05-28 DIAGNOSIS — Z362 Encounter for other antenatal screening follow-up: Secondary | ICD-10-CM | POA: Diagnosis present

## 2023-06-01 ENCOUNTER — Encounter: Payer: Self-pay | Admitting: Family Medicine

## 2023-06-03 ENCOUNTER — Encounter: Payer: Self-pay | Admitting: Family Medicine

## 2023-06-03 ENCOUNTER — Telehealth (INDEPENDENT_AMBULATORY_CARE_PROVIDER_SITE_OTHER): Payer: Managed Care, Other (non HMO) | Admitting: Family Medicine

## 2023-06-03 VITALS — BP 120/72 | HR 88 | Wt 185.0 lb

## 2023-06-03 DIAGNOSIS — Z3492 Encounter for supervision of normal pregnancy, unspecified, second trimester: Secondary | ICD-10-CM

## 2023-06-03 NOTE — Progress Notes (Signed)
I connected with Melinda Meyer 06/03/23 at  2:35 PM EDT by: MyChart video and verified that I am speaking with the correct person using two identifiers.  Patient is located at home and provider is located at [facility].     I discussed the limitations, risks, security and privacy concerns of performing an evaluation and management service by MyChart video and the availability of in person appointments. I also discussed with the patient that there may be a patient responsible charge related to this service. By engaging in this virtual visit, you consent to the provision of healthcare.  Additionally, you authorize for your insurance to be billed for the services provided during this visit.  The patient expressed understanding and agreed to proceed.  The following staff members participated in the virtual visit:  Venora Maples     PRENATAL VISIT NOTE  Subjective:  Melinda Meyer is a 27 y.o. G1P0000 at [redacted]w[redacted]d  for virtual video visit for ongoing prenatal care.  She is currently monitored for the following issues for this low-risk pregnancy and has Supervision of low-risk pregnancy and Ovarian cyst affecting pregnancy in first trimester, antepartum on their problem list.  Patient reports no complaints.  Contractions: Not present. Vag. Bleeding: None.  Movement: Present. Denies leaking of fluid.   The following portions of the patient's history were reviewed and updated as appropriate: allergies, current medications, past family history, past medical history, past social history, past surgical history and problem list.   Objective:   Vitals:   06/03/23 1348  BP: 120/72  Pulse: 88  Weight: 185 lb (83.9 kg)   Self-Obtained  Fetal Status:     Movement: Present     Assessment and Plan:  Pregnancy: G1P0000 at [redacted]w[redacted]d 1. Encounter for supervision of low-risk pregnancy in second trimester Virtual visit due to being diagnosed with COVID recently BP normal Fetal movement is  normal Discussed fasting labs for next visit  Preterm labor symptoms and general obstetric precautions including but not limited to vaginal bleeding, contractions, leaking of fluid and fetal movement were reviewed in detail with the patient.  Return in about 4 weeks (around 07/01/2023) for Dyad patient, ob visit, 28 wk labs.  Future Appointments  Date Time Provider Department Center  06/03/2023  2:35 PM Venora Maples, MD [facility]     Time spent on virtual visit: 5 minutes  Venora Maples, MD

## 2023-06-30 ENCOUNTER — Other Ambulatory Visit: Payer: Self-pay

## 2023-06-30 DIAGNOSIS — Z3490 Encounter for supervision of normal pregnancy, unspecified, unspecified trimester: Secondary | ICD-10-CM

## 2023-07-02 ENCOUNTER — Encounter: Payer: Self-pay | Admitting: Family Medicine

## 2023-07-02 ENCOUNTER — Other Ambulatory Visit: Payer: Managed Care, Other (non HMO)

## 2023-07-02 ENCOUNTER — Ambulatory Visit: Payer: Managed Care, Other (non HMO) | Admitting: Family Medicine

## 2023-07-02 ENCOUNTER — Other Ambulatory Visit: Payer: Self-pay

## 2023-07-02 VITALS — BP 117/78 | HR 87 | Wt 189.0 lb

## 2023-07-02 DIAGNOSIS — N83209 Unspecified ovarian cyst, unspecified side: Secondary | ICD-10-CM

## 2023-07-02 DIAGNOSIS — Z23 Encounter for immunization: Secondary | ICD-10-CM

## 2023-07-02 DIAGNOSIS — Z1332 Encounter for screening for maternal depression: Secondary | ICD-10-CM | POA: Diagnosis not present

## 2023-07-02 DIAGNOSIS — O3483 Maternal care for other abnormalities of pelvic organs, third trimester: Secondary | ICD-10-CM

## 2023-07-02 DIAGNOSIS — Z3A28 28 weeks gestation of pregnancy: Secondary | ICD-10-CM

## 2023-07-02 DIAGNOSIS — Z3492 Encounter for supervision of normal pregnancy, unspecified, second trimester: Secondary | ICD-10-CM

## 2023-07-02 DIAGNOSIS — Z3490 Encounter for supervision of normal pregnancy, unspecified, unspecified trimester: Secondary | ICD-10-CM

## 2023-07-02 NOTE — Patient Instructions (Signed)

## 2023-07-02 NOTE — Progress Notes (Signed)
   Subjective:  Melinda Meyer is a 27 y.o. G1P0000 at [redacted]w[redacted]d being seen today for ongoing prenatal care.  She is currently monitored for the following issues for this low-risk pregnancy and has Supervision of low-risk pregnancy on their problem list.  Patient reports no complaints.  Contractions: Not present. Vag. Bleeding: None.  Movement: Present. Denies leaking of fluid.   The following portions of the patient's history were reviewed and updated as appropriate: allergies, current medications, past family history, past medical history, past social history, past surgical history and problem list. Problem list updated.  Objective:   Vitals:   07/02/23 0848  BP: 117/78  Pulse: 87  Weight: 189 lb (85.7 kg)    Fetal Status: Fetal Heart Rate (bpm): 136 Fundal Height: 29 cm Movement: Present     General:  Alert, oriented and cooperative. Patient is in no acute distress.  Skin: Skin is warm and dry. No rash noted.   Cardiovascular: Normal heart rate noted  Respiratory: Normal respiratory effort, no problems with respiration noted  Abdomen: Soft, gravid, appropriate for gestational age. Pain/Pressure: Present     Pelvic: Vag. Bleeding: None     Cervical exam deferred        Extremities: Normal range of motion.  Edema: Trace  Mental Status: Normal mood and affect. Normal behavior. Normal judgment and thought content.   Urinalysis:      Assessment and Plan:  Pregnancy: G1P0000 at [redacted]w[redacted]d  1. Encounter for supervision of low-risk pregnancy in second trimester BP and FHR normal FH 29, appropriate 28 wk labs today Accepts TDAP, declines flu - Tdap vaccine greater than or equal to 7yo IM  2. Ovarian cyst affecting pregnancy in first trimester, antepartum Resolved on subsequent [place], likely hemorrhagic, resolved from problem list  Preterm labor symptoms and general obstetric precautions including but not limited to vaginal bleeding, contractions, leaking of fluid and fetal movement were  reviewed in detail with the patient. Please refer to After Visit Summary for other counseling recommendations.  Return in 2 weeks (on 07/16/2023) for Dyad patient, ob visit.   Venora Maples, MD

## 2023-07-03 LAB — CBC
Hematocrit: 32.9 % — ABNORMAL LOW (ref 34.0–46.6)
Hemoglobin: 10.4 g/dL — ABNORMAL LOW (ref 11.1–15.9)
MCH: 28.1 pg (ref 26.6–33.0)
MCHC: 31.6 g/dL (ref 31.5–35.7)
MCV: 89 fL (ref 79–97)
Platelets: 253 10*3/uL (ref 150–450)
RBC: 3.7 x10E6/uL — ABNORMAL LOW (ref 3.77–5.28)
RDW: 12.4 % (ref 11.7–15.4)
WBC: 12.1 10*3/uL — ABNORMAL HIGH (ref 3.4–10.8)

## 2023-07-03 LAB — GLUCOSE TOLERANCE, 2 HOURS W/ 1HR
Glucose, 1 hour: 147 mg/dL (ref 70–179)
Glucose, 2 hour: 131 mg/dL (ref 70–152)
Glucose, Fasting: 87 mg/dL (ref 70–91)

## 2023-07-03 LAB — RPR: RPR Ser Ql: NONREACTIVE

## 2023-07-03 LAB — HIV ANTIBODY (ROUTINE TESTING W REFLEX): HIV Screen 4th Generation wRfx: NONREACTIVE

## 2023-07-19 ENCOUNTER — Other Ambulatory Visit: Payer: Self-pay

## 2023-07-19 ENCOUNTER — Ambulatory Visit (INDEPENDENT_AMBULATORY_CARE_PROVIDER_SITE_OTHER): Payer: Managed Care, Other (non HMO) | Admitting: Family Medicine

## 2023-07-19 VITALS — BP 123/80 | HR 80 | Wt 195.2 lb

## 2023-07-19 DIAGNOSIS — Z3A30 30 weeks gestation of pregnancy: Secondary | ICD-10-CM

## 2023-07-19 DIAGNOSIS — D492 Neoplasm of unspecified behavior of bone, soft tissue, and skin: Secondary | ICD-10-CM

## 2023-07-19 DIAGNOSIS — Z3493 Encounter for supervision of normal pregnancy, unspecified, third trimester: Secondary | ICD-10-CM

## 2023-07-20 NOTE — Progress Notes (Unsigned)
   PRENATAL VISIT NOTE  Subjective:  Melinda Meyer is a 27 y.o. G1P0000 at [redacted]w[redacted]d being seen today for ongoing prenatal care.  She is currently monitored for the following issues for this low-risk pregnancy and has Supervision of low-risk pregnancy on their problem list.  Patient reports no bleeding, no contractions, no cramping, and no leaking.  Contractions: Not present. Vag. Bleeding: None.  Movement: Present. Denies leaking of fluid.   The following portions of the patient's history were reviewed and updated as appropriate: allergies, current medications, past family history, past medical history, past social history, past surgical history and problem list.   Objective:   Vitals:   07/19/23 1609  BP: 123/80  Pulse: 80  Weight: 195 lb 3.2 oz (88.5 kg)    Fetal Status: Fetal Heart Rate (bpm): 125   Movement: Present     General:  Alert, oriented and cooperative. Patient is in no acute distress.  Skin: Skin is warm and dry. No rash noted.   Cardiovascular: Normal heart rate noted  Respiratory: Normal respiratory effort, no problems with respiration noted  Abdomen: Soft, gravid, appropriate for gestational age.  Pain/Pressure: Absent     Pelvic: Cervical exam deferred      multiple fleshy colored skin gross on the right posterior labia.  Extremities: Normal range of motion.  Edema: Trace  Mental Status: Normal mood and affect. Normal behavior. Normal judgment and thought content.    Assessment and Plan:  Pregnancy: G1P0000 at [redacted]w[redacted]d 1. Encounter for supervision of low-risk pregnancy in third trimester Fetal heart rate appropriate today BP appropriate today   2. [redacted] weeks gestation of pregnancy  3.  Skin growth Vaginal lesions which appeared to be possible condyloma.  See media tab for image.  Abnormal Pap previously but no HPV.  Not bothering the patient at this time so will not biopsy.  Consider offering cryotherapy at next visit.  Preterm labor symptoms and general  obstetric precautions including but not limited to vaginal bleeding, contractions, leaking of fluid and fetal movement were reviewed in detail with the patient. Please refer to After Visit Summary for other counseling recommendations.   No follow-ups on file.  Future Appointments  Date Time Provider Department Center  08/02/2023  1:15 PM Celedonio Savage, MD [facility]  08/16/2023  2:55 PM Nobie Putnam, Cyndi Lennert, MD [facility]    Celedonio Savage, MD

## 2023-07-21 DIAGNOSIS — D492 Neoplasm of unspecified behavior of bone, soft tissue, and skin: Secondary | ICD-10-CM | POA: Insufficient documentation

## 2023-08-02 ENCOUNTER — Other Ambulatory Visit: Payer: Self-pay

## 2023-08-02 ENCOUNTER — Ambulatory Visit (INDEPENDENT_AMBULATORY_CARE_PROVIDER_SITE_OTHER): Payer: Managed Care, Other (non HMO) | Admitting: Family Medicine

## 2023-08-02 VITALS — BP 120/80 | HR 85 | Wt 194.2 lb

## 2023-08-02 DIAGNOSIS — Z23 Encounter for immunization: Secondary | ICD-10-CM | POA: Diagnosis not present

## 2023-08-02 DIAGNOSIS — Z3A32 32 weeks gestation of pregnancy: Secondary | ICD-10-CM | POA: Diagnosis not present

## 2023-08-02 DIAGNOSIS — Z3493 Encounter for supervision of normal pregnancy, unspecified, third trimester: Secondary | ICD-10-CM

## 2023-08-02 NOTE — Progress Notes (Signed)
   PRENATAL VISIT NOTE  Subjective:  Melinda Meyer is a 27 y.o. G1P0000 at [redacted]w[redacted]d being seen today for ongoing prenatal care.  She is currently monitored for the following issues for this low-risk pregnancy and has Supervision of low-risk pregnancy and Skin growth on their problem list.  Patient reports no bleeding, no contractions, no cramping, and no leaking.  Contractions: Not present. Vag. Bleeding: None.  Movement: Present. Denies leaking of fluid.   The following portions of the patient's history were reviewed and updated as appropriate: allergies, current medications, past family history, past medical history, past social history, past surgical history and problem list.   Objective:   Vitals:   08/02/23 1339  BP: 120/80  Pulse: 85  Weight: 194 lb 3.2 oz (88.1 kg)    Fetal Status: Fetal Heart Rate (bpm): 138   Movement: Present     General:  Alert, oriented and cooperative. Patient is in no acute distress.  Skin: Skin is warm and dry. No rash noted.   Cardiovascular: Normal heart rate noted  Respiratory: Normal respiratory effort, no problems with respiration noted  Abdomen: Soft, gravid, appropriate for gestational age.  Pain/Pressure: Absent     Pelvic: Cervical exam deferred        Extremities: Normal range of motion.     Mental Status: Normal mood and affect. Normal behavior. Normal judgment and thought content.   Assessment and Plan:  Pregnancy: G1P0000 at [redacted]w[redacted]d 1. Encounter for supervision of low-risk pregnancy in third trimester FHR and BP within normal limits today Continue routine prenatal care  2. [redacted] weeks gestation of pregnancy - Respiratory syncytial virus vaccine, preF, subunit, bivalent,(Abrysvo)  Preterm labor symptoms and general obstetric precautions including but not limited to vaginal bleeding, contractions, leaking of fluid and fetal movement were reviewed in detail with the patient. Please refer to After Visit Summary for other counseling  recommendations.   No follow-ups on file.  Future Appointments  Date Time Provider Department Center  08/16/2023  2:55 PM Celedonio Savage, MD [facility]  08/30/2023  3:35 PM Celedonio Savage, MD [facility]  09/06/2023  3:15 PM Celedonio Savage, MD [facility]  09/13/2023  3:55 PM Celedonio Savage, MD [facility]  09/20/2023  3:35 PM Federico Flake, MD [facility]    Celedonio Savage, MD

## 2023-08-16 ENCOUNTER — Ambulatory Visit (INDEPENDENT_AMBULATORY_CARE_PROVIDER_SITE_OTHER): Payer: Managed Care, Other (non HMO) | Admitting: Family Medicine

## 2023-08-16 ENCOUNTER — Other Ambulatory Visit: Payer: Self-pay

## 2023-08-16 VITALS — BP 126/73 | HR 97 | Wt 196.4 lb

## 2023-08-16 DIAGNOSIS — Z3493 Encounter for supervision of normal pregnancy, unspecified, third trimester: Secondary | ICD-10-CM

## 2023-08-16 DIAGNOSIS — Z3A34 34 weeks gestation of pregnancy: Secondary | ICD-10-CM

## 2023-08-16 NOTE — Progress Notes (Signed)
   PRENATAL VISIT NOTE  Subjective:  Melinda Meyer is a 27 y.o. G1P0000 at [redacted]w[redacted]d being seen today for ongoing prenatal care.  She is currently monitored for the following issues for this low-risk pregnancy and has Supervision of low-risk pregnancy and Skin growth on their problem list.  Patient reports no bleeding, no contractions, no cramping, and no leaking.  Contractions: Irritability. Vag. Bleeding: None.  Movement: Present. Denies leaking of fluid.   The following portions of the patient's history were reviewed and updated as appropriate: allergies, current medications, past family history, past medical history, past social history, past surgical history and problem list.   Objective:   Vitals:   08/16/23 1455  BP: 126/73  Pulse: 97  Weight: 196 lb 6.4 oz (89.1 kg)    Fetal Status: Fetal Heart Rate (bpm): 130   Movement: Present     General:  Alert, oriented and cooperative. Patient is in no acute distress.  Skin: Skin is warm and dry. No rash noted.   Cardiovascular: Normal heart rate noted  Respiratory: Normal respiratory effort, no problems with respiration noted  Abdomen: Soft, gravid, appropriate for gestational age.  Pain/Pressure: Present     Pelvic: Cervical exam deferred        Extremities: Normal range of motion.     Mental Status: Normal mood and affect. Normal behavior. Normal judgment and thought content.   Assessment and Plan:  Pregnancy: G1P0000 at [redacted]w[redacted]d 1. Encounter for supervision of low-risk pregnancy in third trimester FHR and BP appropriate today Continue routine prenatal care Swabs at next visit Discussed prenatal care  2. [redacted] weeks gestation of pregnancy   Preterm labor symptoms and general obstetric precautions including but not limited to vaginal bleeding, contractions, leaking of fluid and fetal movement were reviewed in detail with the patient. Please refer to After Visit Summary for other counseling recommendations.   No follow-ups on  file.  Future Appointments  Date Time Provider Department Center  08/30/2023  3:35 PM Celedonio Savage, MD [facility]  09/06/2023  3:15 PM Celedonio Savage, MD [facility]  09/13/2023  3:55 PM Celedonio Savage, MD [facility]  09/20/2023  3:35 PM Federico Flake, MD [facility]    Celedonio Savage, MD

## 2023-08-30 ENCOUNTER — Other Ambulatory Visit: Payer: Self-pay

## 2023-08-30 ENCOUNTER — Ambulatory Visit (INDEPENDENT_AMBULATORY_CARE_PROVIDER_SITE_OTHER): Payer: Managed Care, Other (non HMO) | Admitting: Family Medicine

## 2023-08-30 ENCOUNTER — Other Ambulatory Visit (HOSPITAL_COMMUNITY)
Admission: RE | Admit: 2023-08-30 | Discharge: 2023-08-30 | Disposition: A | Discharge status: Home / Self Care | Payer: Managed Care, Other (non HMO) | Source: Ambulatory Visit | Attending: Family Medicine | Admitting: Family Medicine

## 2023-08-30 VITALS — BP 118/83 | HR 91 | Wt 198.0 lb

## 2023-08-30 DIAGNOSIS — Z3A36 36 weeks gestation of pregnancy: Secondary | ICD-10-CM

## 2023-08-30 DIAGNOSIS — Z3493 Encounter for supervision of normal pregnancy, unspecified, third trimester: Secondary | ICD-10-CM | POA: Diagnosis present

## 2023-08-30 NOTE — Progress Notes (Signed)
   PRENATAL VISIT NOTE  Subjective:  Melinda Meyer is a 27 y.o. G1P0000 at [redacted]w[redacted]d being seen today for ongoing prenatal care.  She is currently monitored for the following issues for this low-risk pregnancy and has Supervision of low-risk pregnancy and Skin growth on their problem list.  Patient reports no bleeding, no cramping, no leaking, and occasional contractions.  Contractions: Irritability. Vag. Bleeding: None.  Movement: Present. Denies leaking of fluid.   The following portions of the patient's history were reviewed and updated as appropriate: allergies, current medications, past family history, past medical history, past social history, past surgical history and problem list.   Objective:   Vitals:   08/30/23 1556  BP: 118/83  Pulse: 91  Weight: 198 lb (89.8 kg)    Fetal Status: Fetal Heart Rate (bpm): 145   Movement: Present     General:  Alert, oriented and cooperative. Patient is in no acute distress.  Skin: Skin is warm and dry. No rash noted.   Cardiovascular: Normal heart rate noted  Respiratory: Normal respiratory effort, no problems with respiration noted  Abdomen: Soft, gravid, appropriate for gestational age.  Pain/Pressure: Present     Pelvic: Cervical exam performed in the presence of a chaperone      1/60/-3  Extremities: Normal range of motion.     Mental Status: Normal mood and affect. Normal behavior. Normal judgment and thought content.   Assessment and Plan:  Pregnancy: G1P0000 at [redacted]w[redacted]d 1. Encounter for supervision of low-risk pregnancy in third trimester FHR and BP appropriate today continue routine prenatal care - Culture, beta strep (group b only) - Cervicovaginal ancillary only( [place])  2. [redacted] weeks gestation of pregnancy Swabs collected today  Preterm labor symptoms and general obstetric precautions including but not limited to vaginal bleeding, contractions, leaking of fluid and fetal movement were reviewed in detail with the  patient. Please refer to After Visit Summary for other counseling recommendations.   No follow-ups on file.  Future Appointments  Date Time Provider Department Center  09/06/2023  3:15 PM Celedonio Savage, MD [facility]  09/13/2023  3:55 PM Celedonio Savage, MD [facility]  09/20/2023  3:35 PM Federico Flake, MD [facility]    Celedonio Savage, MD

## 2023-08-31 LAB — CERVICOVAGINAL ANCILLARY ONLY
Bacterial Vaginitis (gardnerella): NEGATIVE
Candida Glabrata: NEGATIVE
Candida Vaginitis: POSITIVE — AB
Chlamydia: NEGATIVE
Comment: NEGATIVE
Comment: NEGATIVE
Comment: NEGATIVE
Comment: NEGATIVE
Comment: NEGATIVE
Comment: NORMAL
Neisseria Gonorrhea: NEGATIVE
Trichomonas: NEGATIVE

## 2023-09-02 LAB — CULTURE, BETA STREP (GROUP B ONLY): Strep Gp B Culture: NEGATIVE

## 2023-09-03 MED ORDER — METRONIDAZOLE 500 MG PO TABS: 500.0000 mg | ORAL_TABLET | Freq: Two times a day (BID) | ORAL | 0 refills | Status: DC

## 2023-09-03 MED ORDER — FLUCONAZOLE 150 MG PO TABS: 150.0000 mg | ORAL_TABLET | Freq: Once | ORAL | 0 refills | Status: AC

## 2023-09-03 NOTE — Addendum Note (Signed)
Addended by: Celedonio Savage on: 09/03/2023 12:05 PM   Modules accepted: Orders

## 2023-09-03 NOTE — Addendum Note (Signed)
Addended by: Celedonio Savage on: 09/03/2023 12:07 PM   Modules accepted: Orders

## 2023-09-06 ENCOUNTER — Ambulatory Visit (INDEPENDENT_AMBULATORY_CARE_PROVIDER_SITE_OTHER): Payer: Managed Care, Other (non HMO) | Admitting: Family Medicine

## 2023-09-06 ENCOUNTER — Inpatient Hospital Stay (HOSPITAL_COMMUNITY)
Admission: AD | Admit: 2023-09-06 | Discharge: 2023-09-06 | Disposition: A | Discharge status: Home / Self Care | Payer: Managed Care, Other (non HMO) | Attending: Obstetrics & Gynecology | Admitting: Obstetrics & Gynecology

## 2023-09-06 ENCOUNTER — Other Ambulatory Visit: Payer: Self-pay

## 2023-09-06 ENCOUNTER — Encounter (HOSPITAL_COMMUNITY): Payer: Self-pay | Admitting: Obstetrics & Gynecology

## 2023-09-06 VITALS — BP 126/85 | HR 103 | Wt 198.0 lb

## 2023-09-06 DIAGNOSIS — O26893 Other specified pregnancy related conditions, third trimester: Secondary | ICD-10-CM | POA: Diagnosis not present

## 2023-09-06 DIAGNOSIS — O321XX0 Maternal care for breech presentation, not applicable or unspecified: Secondary | ICD-10-CM | POA: Insufficient documentation

## 2023-09-06 DIAGNOSIS — Z3A37 37 weeks gestation of pregnancy: Secondary | ICD-10-CM | POA: Diagnosis not present

## 2023-09-06 DIAGNOSIS — Z3493 Encounter for supervision of normal pregnancy, unspecified, third trimester: Secondary | ICD-10-CM

## 2023-09-06 DIAGNOSIS — Z0371 Encounter for suspected problem with amniotic cavity and membrane ruled out: Secondary | ICD-10-CM

## 2023-09-06 LAB — POCT FERN TEST: POCT Fern Test: NEGATIVE

## 2023-09-06 NOTE — MAU Note (Addendum)
Melinda Meyer is a 27 y.o. at [redacted]w[redacted]d here in MAU reporting: She was seen in the office today and was told to report to MAU for rule out rupture. She reports has been leaking today since 9 am. She reports light spotting as well, she reports her last intercourse was last night. Endorses +FM.   Onset of complaint: 10 am Pain score: 0/10 Vitals:   09/06/23 1636  BP: 136/72  Pulse: 92  Resp: 16  Temp: 98 F (36.7 C)     FHT:123 Lab orders placed from triage:  Crist Fat

## 2023-09-06 NOTE — MAU Note (Signed)
Fern test was only completed once. Result not entered correctly the first time.

## 2023-09-06 NOTE — MAU Provider Note (Signed)
History     CSN: [id number]  Arrival date and time: 09/06/23 1619   Event Date/Time   First Provider Initiated Contact with Patient 09/06/23 1725      Chief Complaint  Patient presents with   Rupture of Membranes   HPI [name] is a 27 y.o. year old G41P0000 female at [redacted]w[redacted]d weeks gestation who was sent to MAU from her OB appt reporting leaking of fluid and some blood streaks in it since 1000 this AM. She reports she last had SI last night. She endorses some mild lower back pain and ?contractions. She receives [facility]; next appt is 09/13/2023. Her mom is present and contributing to the history taking.   OB History     Gravida  1   Para  0   Term  0   Preterm  0   AB  0   Living  0      SAB  0   IAB  0   Ectopic  0   Multiple  0   Live Births  0           History reviewed. No pertinent past medical history.  Past Surgical History:  Procedure Laterality Date   FOOT SURGERY     TONSILLECTOMY      Family History  Problem Relation Age of Onset   Asthma Mother    Diabetes Father    Depression Paternal Grandmother    Cancer Neg Hx    Heart disease Neg Hx    Hypertension Neg Hx     Social History   Tobacco Use   Smoking status: Never   Smokeless tobacco: Never  Vaping Use   Vaping status: Former   Quit date: 09/21/2022   Substances: Nicotine, Flavoring  Substance Use Topics   Alcohol use: No   Drug use: No    Allergies: No Known Allergies  Medications Prior to Admission  Medication Sig Dispense Refill Last Dose/Taking   Prenatal Vit-Fe Fumarate-FA (MULTIVITAMIN-PRENATAL) 27-0.8 MG TABS tablet Take 1 tablet by mouth daily at 12 noon.   Past Month    Review of Systems  Constitutional: Negative.   HENT: Negative.    Eyes: Negative.   Respiratory: Negative.    Cardiovascular: Negative.   Gastrointestinal: Negative.   Endocrine: Negative.   Genitourinary:  Positive for pelvic pain and vaginal discharge (leaking fluid  since 1000).  Musculoskeletal: Negative.   Skin: Negative.   Allergic/Immunologic: Negative.   Neurological: Negative.   Hematological: Negative.   Psychiatric/Behavioral: Negative.     Physical Exam   Blood pressure 136/72, pulse 92, temperature 98 F (36.7 C), temperature source Oral, resp. rate 16, height 5\' 8"  (1.727 m), weight 86.2 kg.  Physical Exam Genitourinary:    General: Normal vulva.     Comments: Pelvic exam: External genitalia normal, SE: vaginal walls pink and well rugated, cervix is smooth, pink, no lesions, moderate amt of mucoid discharge, ROM Plus obtained, cervix visually dilated.  Dilation: 3 Effacement (%): 70 Cervical Position: Middle Station: Ballotable Presentation: Undeterminable, mobile fetal part palpable but unsure what is presenting - will U/S to verify Exam by:: Raelyn Mora, CNM    REACTIVE NST - FHR: 115 bpm / moderate variability / accels present / decels absent / TOCO: irregular UC's with UI Noted  MAU Course  Procedures Patient informed that the ultrasound is considered a limited OB ultrasound and is not intended to be a complete ultrasound exam.  Patient also informed that the  ultrasound is not being completed with the intent of assessing for fetal or placental anomalies or any pelvic abnormalities.  Explained that the purpose of today's ultrasound is to assess for presentation.  Baby was found to be in a breech presentation. Patient acknowledges the purpose of the exam and the limitations of the study.  MDM Fern Slide ROB Plus collected, but not sent c/t BBOW visualized on SSE and palpated on SVE Informal BS U/S  Results for orders placed or performed during the hospital encounter of 09/06/23 (from the past 24 hours)  Crist Fat Test     Status: None   Collection Time: 09/06/23  4:38 PM  Result Value Ref Range   POCT Fern Test    *Consult with Dr. Crissie Reese @ 1800 - notified of patient's baby being breech and patient desiring ECV, tx plan  [facility] to speak to patient about ECV and get scheduled for the ECV  Assessment and Plan  1. No leakage of amniotic fluid into vagina (Primary) - Reassurance given that the leaking she is experiencing is from SI last night  2. Breech presentation, single or unspecified fetus - Advised to come the hospital right away, if her water breaks. - Scheduled for ECV on Wednesday 09/08/2023 with Dr. Crissie Reese  3. [redacted] weeks gestation of pregnancy  - Discharge home - Keep scheduled appt for ECV on Wednesday 09/08/2023 - Patient verbalized an understanding of the plan of care and agrees.   Raelyn Mora, CNM 09/06/2023, 5:25 PM

## 2023-09-06 NOTE — Discharge Instructions (Addendum)
Your baby is in a BREECH presentation right now. You have been scheduled to come to labor and delivery unit to have your baby turned externally by Dr. Crissie Reese on Wednesday 09/08/2023. Someone from the hospital will call you to tell you when you should arrive.  IF YOUR WATER BREAKS BEFORE WEDNESDAY, COME TO THE HOSPITAL RIGHT AWAY!!

## 2023-09-06 NOTE — Progress Notes (Signed)
   PRENATAL VISIT NOTE  Subjective:  Melinda Meyer is a 27 y.o. G1P0000 at [redacted]w[redacted]d being seen today for ongoing prenatal care.  She is currently monitored for the following issues for this low-risk pregnancy and has Supervision of low-risk pregnancy and Skin growth on their problem list.  Patient reports no bleeding and no cramping.  Contractions: Irregular. Vag. Bleeding: Scant (intercourse last night; had some bleeding afterwards that has now turned into spotting).  Movement: Present. Denies leaking of fluid.   The following portions of the patient's history were reviewed and updated as appropriate: allergies, current medications, past family history, past medical history, past social history, past surgical history and problem list.   Objective:   Vitals:   09/06/23 1545  BP: 126/85  Pulse: (!) 103  Weight: 198 lb (89.8 kg)    Fetal Status: Fetal Heart Rate (bpm): 150   Movement: Present     General:  Alert, oriented and cooperative. Patient is in no acute distress.  Skin: Skin is warm and dry. No rash noted.   Cardiovascular: Normal heart rate noted  Respiratory: Normal respiratory effort, no problems with respiration noted  Abdomen: Soft, gravid, appropriate for gestational age.  Pain/Pressure: Present     Pelvic: Cervical exam deferred        Extremities: Normal range of motion.  Edema: Mild pitting, slight indentation  Mental Status: Normal mood and affect. Normal behavior. Normal judgment and thought content.   Assessment and Plan:  Pregnancy: G1P0000 at [redacted]w[redacted]d 1. Encounter for supervision of low-risk pregnancy in third trimester (Primary) FHR and BP appropriate today Patient reports that she has been leaking fluid today as well as noticed spotting after intercourse.  Concern for rupture membranes.  Discussed going to the MAU for rule out rupture and she is agreeable. Called the MAU to inform them that she is coming.  2. [redacted] weeks gestation of pregnancy Follow-up in 1 week  if not ruptured  Term labor symptoms and general obstetric precautions including but not limited to vaginal bleeding, contractions, leaking of fluid and fetal movement were reviewed in detail with the patient. Please refer to After Visit Summary for other counseling recommendations.   No follow-ups on file.  Future Appointments  Date Time Provider Department Center  09/13/2023  3:55 PM Celedonio Savage, MD [facility]  09/20/2023  3:35 PM Federico Flake, MD [facility]    Celedonio Savage, MD

## 2023-09-08 ENCOUNTER — Observation Stay (HOSPITAL_COMMUNITY)
Admission: RE | Admit: 2023-09-08 | Discharge: 2023-09-08 | DRG: 833 | Disposition: A | Discharge status: Home / Self Care | Payer: Managed Care, Other (non HMO) | Source: Ambulatory Visit | Attending: Family Medicine | Admitting: Family Medicine

## 2023-09-08 ENCOUNTER — Telehealth (HOSPITAL_COMMUNITY): Payer: Self-pay | Admitting: *Deleted

## 2023-09-08 ENCOUNTER — Encounter (HOSPITAL_COMMUNITY): Payer: Self-pay | Admitting: Obstetrics & Gynecology

## 2023-09-08 ENCOUNTER — Encounter (HOSPITAL_COMMUNITY): Payer: Self-pay

## 2023-09-08 ENCOUNTER — Encounter: Payer: Self-pay | Admitting: Family Medicine

## 2023-09-08 ENCOUNTER — Observation Stay (HOSPITAL_COMMUNITY)
Admission: AD | Admit: 2023-09-08 | Discharge: 2023-09-08 | Disposition: A | Discharge status: Home / Self Care | Payer: Managed Care, Other (non HMO) | Source: Home / Self Care | Attending: Obstetrics and Gynecology | Admitting: Obstetrics and Gynecology

## 2023-09-08 DIAGNOSIS — Z3A37 37 weeks gestation of pregnancy: Secondary | ICD-10-CM | POA: Diagnosis not present

## 2023-09-08 DIAGNOSIS — O321XX0 Maternal care for breech presentation, not applicable or unspecified: Secondary | ICD-10-CM | POA: Diagnosis present

## 2023-09-08 DIAGNOSIS — Z825 Family history of asthma and other chronic lower respiratory diseases: Secondary | ICD-10-CM

## 2023-09-08 DIAGNOSIS — O329XX0 Maternal care for malpresentation of fetus, unspecified, not applicable or unspecified: Secondary | ICD-10-CM | POA: Insufficient documentation

## 2023-09-08 DIAGNOSIS — Z833 Family history of diabetes mellitus: Secondary | ICD-10-CM

## 2023-09-08 DIAGNOSIS — Z818 Family history of other mental and behavioral disorders: Secondary | ICD-10-CM

## 2023-09-08 LAB — CBC
HCT: 29.7 % — ABNORMAL LOW (ref 36.0–46.0)
Hemoglobin: 9.3 g/dL — ABNORMAL LOW (ref 12.0–15.0)
MCH: 26.3 pg (ref 26.0–34.0)
MCHC: 31.3 g/dL (ref 30.0–36.0)
MCV: 83.9 fL (ref 80.0–100.0)
Platelets: 288 10*3/uL (ref 150–400)
RBC: 3.54 MIL/uL — ABNORMAL LOW (ref 3.87–5.11)
RDW: 13.9 % (ref 11.5–15.5)
WBC: 10.4 10*3/uL (ref 4.0–10.5)
nRBC: 0 % (ref 0.0–0.2)

## 2023-09-08 LAB — TYPE AND SCREEN
ABO/RH(D): A POS
Antibody Screen: NEGATIVE

## 2023-09-08 MED ORDER — SODIUM CHLORIDE 0.9% FLUSH
3.0000 mL | Freq: Two times a day (BID) | INTRAVENOUS | Status: DC
Start: 2023-09-08 — End: 2023-09-08

## 2023-09-08 MED ORDER — SODIUM CHLORIDE 0.9 % IV SOLN: 250.0000 mL | INTRAVENOUS | Status: DC | PRN

## 2023-09-08 MED ORDER — SODIUM CHLORIDE 0.9% FLUSH
3.0000 mL | INTRAVENOUS | Status: DC | PRN
  Administered 2023-09-08: 3 mL via INTRAVENOUS

## 2023-09-08 MED ORDER — SODIUM CHLORIDE 0.9% FLUSH: 3.0000 mL | INTRAVENOUS | Status: DC | PRN

## 2023-09-08 MED ORDER — TERBUTALINE SULFATE 1 MG/ML IJ SOLN
INTRAMUSCULAR | Status: AC
  Filled 2023-09-08: qty 1

## 2023-09-08 MED ORDER — TERBUTALINE SULFATE 1 MG/ML IJ SOLN
0.2500 mg | Freq: Once | INTRAMUSCULAR | Status: DC
  Administered 2023-09-08: 0.25 mg via SUBCUTANEOUS

## 2023-09-08 NOTE — Patient Instructions (Signed)
Andresha Mccrackin  09/08/2023   Your procedure is scheduled on:  09/17/2023  Arrive at 0745 at Entrance C on [address] at [facility]  and [employer]. You are invited to use the FREE valet parking or use the Visitor's parking deck.  Pick up the phone at the desk and dial [phone].  Call this number if you have problems the morning of surgery: [phone]  Remember:   Do not eat food:(After Midnight) Desps de medianoche.  You may drink clear liquids until arrival at ___0745__.  Clear liquids means a liquid you can see thru.  It can have color such as Cola or Kool aid.  Tea is OK and coffee as long as no milk or creamer of any kind.  Take these medicines the morning of surgery with A SIP OF WATER:  none   Do not wear jewelry, make-up or nail polish.  Do not wear lotions, powders, or perfumes. Do not wear deodorant.  Do not shave 48 hours prior to surgery.  Do not bring valuables to the hospital.  [facility] is not   responsible for any belongings or valuables brought to the hospital.  Contacts, dentures or bridgework may not be worn into surgery.  Leave suitcase in the car. After surgery it may be brought to your room.  For patients admitted to the hospital, checkout time is 11:00 AM the day of              discharge.      Please read over the following fact sheets that you were given:     Preparing for Surgery

## 2023-09-08 NOTE — Discharge Summary (Signed)
OBGYN Discharge Summary    Patient Name: Melinda Meyer DOB: 08/10/1996 MRN: [id number]  Date of admission: 09/08/2023 Delivery date:This patient has no babies on file. Delivering provider: This patient has no babies on file. Date of discharge: 09/08/2023  Admitting diagnosis: Breech presentation of fetus [O32.1XX0] Intrauterine pregnancy: [redacted]w[redacted]d     Secondary diagnosis:  Principal Problem:   Breech presentation of fetus  Discharge diagnosis:  Unsuccessful ECV                                                Hospital course: Patient arrived for scheduled ECV, which was unsuccessful. See procedure note for details. She will be scheduled for 39wk primary cesarean.   Immunizations received: Immunization History  Administered Date(s) Administered   DTaP 10/30/1998, 01/09/1999, 03/20/1999, 04/29/2000, 02/20/2003   Dtap, Unspecified 02/20/2003   HIB (PRP-OMP) 10/30/1998, 01/09/1999   HIB (PRP-T) 10/30/1998, 01/09/1999, 06/24/1999, 06/24/1999, 04/29/2000, 04/29/2000   Hep B, Unspecified 01/09/1999, 03/20/1999, 06/24/1999   Hepatitis B, PED/ADOLESCENT 01/09/1999, 03/20/1999, 06/24/1999   IPV 10/30/1998, 01/09/1999, 12/02/1999, 02/20/2003   MMR 12/02/1999, 02/20/2003   Meningococcal Acwy, Unspecified 02/02/2012   Meningococcal Conjugate 02/02/2012, 02/02/2012, 01/04/2017   PFIZER(Purple Top)SARS-COV-2 Vaccination 07/09/2020, 07/30/2020   Pneumococcal Conjugate PCV 7 12/02/1999, 02/05/2000, 02/04/2006   Pneumococcal Conjugate-13 12/02/1999, 02/04/2006   Polio, Unspecified 02/20/2003   Rsv, Bivalent, Protein Subunit Rsvpref,pf Verdis Frederickson) 08/02/2023   Tdap 05/28/2010, 01/04/2017, 12/19/2018, 07/02/2023   Varicella 12/02/1999, 01/11/2007    Physical exam  Vitals:   09/08/23 0958 09/08/23 0959 09/08/23 1000 09/08/23 1030  BP:      Pulse: 89 91 80 78  Resp:      Temp:      TempSrc:      SpO2: 98% 97% 98% 95%  Weight:      Height:       Physical Exam Constitutional:      General:  She is not in acute distress.    Appearance: Normal appearance. She is not ill-appearing.  HENT:     Head: Atraumatic.  Eyes:     General: No scleral icterus.    Conjunctiva/sclera: Conjunctivae normal.  Pulmonary:     Effort: Pulmonary effort is normal.  Skin:    General: Skin is warm and dry.     Coloration: Skin is not jaundiced or pale.  Neurological:     Mental Status: She is alert.     Coordination: Coordination normal.  Psychiatric:        Mood and Affect: Mood normal.        Behavior: Behavior normal.     Labs: Lab Results  Component Value Date   WBC 10.4 09/08/2023   HGB 9.3 (L) 09/08/2023   HCT 29.7 (L) 09/08/2023   MCV 83.9 09/08/2023   PLT 288 09/08/2023       No data to display         Edinburgh Score:     No data to display         No data recorded  After visit meds:  Allergies as of 09/08/2023   No Known Allergies      Medication List     TAKE these medications    multivitamin-prenatal 27-0.8 MG Tabs tablet Take 1 tablet by mouth daily at 12 noon.         Discharge home in stable condition  Future Appointments:  Future Appointments  Date Time Provider Department Center  09/13/2023  3:55 PM Celedonio Savage, MD [facility]  09/20/2023  3:35 PM Federico Flake, MD [facility]     09/08/2023 Venora Maples, MD

## 2023-09-08 NOTE — Procedures (Signed)
ECV Procedure Note Patient was not given a spinal prior to attempting the procedure. NST was reactive prior to the procedure.  Patient was given 0.25 mg of SQ terbutaline prior to initiating the procedure. ECV was attempted 3 times. Procedure was not successful. Patient was monitored for approximately 40 minutes and had a Cat I tracing with reactive NST during that time.

## 2023-09-08 NOTE — Telephone Encounter (Signed)
Preadmission screen  

## 2023-09-08 NOTE — H&P (Signed)
Faculty Practice H&P  Melinda Meyer is a 27 y.o. female G1P0000 with IUP at [redacted]w[redacted]d presenting for external cephalic version for breech presentation. Pregnancy was been complicated by breech presentation in the third trimester.    Pt states she has been having no contractions,  vaginal bleeding, intact membranes, with normal fetal movement.     Prenatal Course Source of Care:  [facility]  with onset of care at 11weeks  Pregnancy complications or risks: Patient Active Problem List   Diagnosis Date Noted   Fetal malpresentation 09/08/2023   Skin growth 07/21/2023   Supervision of low-risk pregnancy 02/11/2023   She desires  Undecided  for contraception.  She plans to breastfeed  Prenatal labs and studies: ABO, Rh: --/--/PENDING (12/18 4696) Antibody: PENDING (12/18 0836) Rubella: 3.05 (06/19 1642) RPR: Non Reactive (10/11 0855)  HBsAg: Negative (06/19 1642)  HIV: Non Reactive (10/11 0855)  GBS: Negative/-- (12/09 0446)  2hr Glucola: negative Genetic screening: normal Anatomy US: normal  Past Medical History: History reviewed. No pertinent past medical history.  Past Surgical History:  Past Surgical History:  Procedure Laterality Date   FOOT SURGERY     TONSILLECTOMY      Obstetrical History:  OB History     Gravida  1   Para  0   Term  0   Preterm  0   AB  0   Living  0      SAB  0   IAB  0   Ectopic  0   Multiple  0   Live Births  0           Gynecological History:  OB History     Gravida  1   Para  0   Term  0   Preterm  0   AB  0   Living  0      SAB  0   IAB  0   Ectopic  0   Multiple  0   Live Births  0           Social History:  Social History   Socioeconomic History   Marital status: Single    Spouse name: Not on file   Number of children: Not on file   Years of education: Not on file   Highest education level: 12th grade  Occupational History   Not on file  Tobacco Use   Smoking status: Never    Smokeless tobacco: Never  Vaping Use   Vaping status: Former   Quit date: 09/21/2022   Substances: Nicotine, Flavoring  Substance and Sexual Activity   Alcohol use: No   Drug use: No   Sexual activity: Yes    Birth control/protection: None    Comment: Nuva Ring  Other Topics Concern   Not on file  Social History Narrative   Not on file   Social Drivers of Health   Financial Resource Strain: Low Risk  (07/19/2023)   Overall Financial Resource Strain (CARDIA)    Difficulty of Paying Living Expenses: Not hard at all  Food Insecurity: No Food Insecurity (09/08/2023)   Hunger Vital Sign    Worried About Running Out of Food in the Last Year: Never true    Ran Out of Food in the Last Year: Never true  Transportation Needs: No Transportation Needs (09/08/2023)   PRAPARE - Administrator, Civil Service (Medical): No    Lack of Transportation (Non-Medical): No  Physical Activity: Patient Declined (07/19/2023)  Exercise Vital Sign    Days of Exercise per Week: Patient declined    Minutes of Exercise per Session: Patient declined  Stress: No Stress Concern Present (07/19/2023)   [employer] of Occupational Health - Occupational Stress Questionnaire    Feeling of Stress : Not at all  Social Connections: Unknown (07/19/2023)   Social Connection and Isolation Panel [NHANES]    Frequency of Communication with Friends and Family: More than three times a week    Frequency of Social Gatherings with Friends and Family: More than three times a week    Attends Religious Services: Patient declined    Database administrator or Organizations: Patient declined    Attends Engineer, structural: Not on file    Marital Status: Patient declined    Family History:  Family History  Problem Relation Age of Onset   Asthma Mother    Diabetes Father    Depression Paternal Grandmother    Cancer Neg Hx    Heart disease Neg Hx    Hypertension Neg Hx     Medications:   Prenatal vitamins,  No current facility-administered medications for this encounter.   Facility-Administered Medications Ordered in Other Encounters  Medication Dose Route Frequency Provider Last Rate Last Admin   0.9 %  sodium chloride infusion  250 mL Intravenous PRN Milas Hock, MD       0.9 %  sodium chloride infusion  250 mL Intravenous PRN Milas Hock, MD       sodium chloride flush (NS) 0.9 % injection 3 mL  3 mL Intravenous Q12H Milas Hock, MD       sodium chloride flush (NS) 0.9 % injection 3 mL  3 mL Intravenous PRN Milas Hock, MD       sodium chloride flush (NS) 0.9 % injection 3 mL  3 mL Intravenous Q12H Milas Hock, MD       sodium chloride flush (NS) 0.9 % injection 3 mL  3 mL Intravenous PRN Milas Hock, MD   3 mL at 09/08/23 0835    Allergies: No Known Allergies  Review of Systems: -  Negative  Physical Exam: Blood pressure 125/70, pulse 78, temperature 98.6 F (37 C), temperature source Oral, height 5\' 8"  (1.727 m), weight 90.6 kg, SpO2 97%. GENERAL: Well-developed, well-nourished female in no acute distress.  LUNGS: Clear to auscultation bilaterally.  HEART: Regular rate and rhythm. ABDOMEN: Soft, nontender, nondistended, gravid. EFW 7 lbs EXTREMITIES: Nontender, no edema, 2+ distal pulses. Presentation: breech  FHT:  Baseline rate 120 bpm   Variability moderate  Accelerations present   Decelerations none Contractions: Every 3 mins   Pertinent Labs/Studies:   Lab Results  Component Value Date   WBC 12.1 (H) 07/02/2023   HGB 10.4 (L) 07/02/2023   HCT 32.9 (L) 07/02/2023   MCV 89 07/02/2023   PLT 253 07/02/2023    Assessment : Melinda Meyer is a 27 y.o. G1P0000 at [redacted]w[redacted]d being admitted for ECV secondary to breech presentation.   Plan:  Discussed implications of breech presentation; discussed that the fetus can spontaneously turn to cephalic presentation.  Other interventions could be external cephalic version which is done in the hospital vs  moxibustion vs planned cesarean delivery.  Also referred to the website spinningbabies.org, this can go through maneuvers that could help. Risks/benefits of all modalities discussed in detail. Patient desires ECV. Information was given to her to review at home.   Discussed options of external cephalic version (ECV) with subsequent induction of  labor vs outright cesarean section. Risks of ECV including abnormal fetal heart rate, PROM, abruption, injury to fetus, possible emergency cesarean section, and failed ECV were discussed with the patient and her husband.  Also discussed risks of cesarean section including but were not limited to: bleeding which may require transfusion or reoperation; infection which may require antibiotics; injury to bowel, bladder, ureters or other surrounding organs; injury to the fetus; need for additional procedures including hysterectomy in the event of a life-threatening hemorrhage; formation of adhesions; placental abnormalities with subsequent pregnancies; incisional problems; thromboembolic phenomenon and other postoperative/anesthesia complications.   All questions answered.    Celedonio Savage, MD 09/08/2023, 9:16 AM

## 2023-09-11 ENCOUNTER — Encounter (HOSPITAL_COMMUNITY): Payer: Self-pay | Admitting: Obstetrics and Gynecology

## 2023-09-11 ENCOUNTER — Other Ambulatory Visit: Payer: Self-pay

## 2023-09-11 ENCOUNTER — Inpatient Hospital Stay (HOSPITAL_COMMUNITY)
Admission: AD | Admit: 2023-09-11 | Discharge: 2023-09-14 | DRG: 787 | Disposition: A | Discharge status: Home / Self Care | Payer: Managed Care, Other (non HMO) | Attending: Obstetrics and Gynecology | Admitting: Obstetrics and Gynecology

## 2023-09-11 DIAGNOSIS — Z3490 Encounter for supervision of normal pregnancy, unspecified, unspecified trimester: Secondary | ICD-10-CM

## 2023-09-11 DIAGNOSIS — Z833 Family history of diabetes mellitus: Secondary | ICD-10-CM

## 2023-09-11 DIAGNOSIS — Z3A38 38 weeks gestation of pregnancy: Secondary | ICD-10-CM

## 2023-09-11 DIAGNOSIS — O4202 Full-term premature rupture of membranes, onset of labor within 24 hours of rupture: Principal | ICD-10-CM | POA: Diagnosis present

## 2023-09-11 DIAGNOSIS — Z98891 History of uterine scar from previous surgery: Principal | ICD-10-CM

## 2023-09-11 DIAGNOSIS — O329XX0 Maternal care for malpresentation of fetus, unspecified, not applicable or unspecified: Secondary | ICD-10-CM | POA: Diagnosis present

## 2023-09-11 DIAGNOSIS — O321XX0 Maternal care for breech presentation, not applicable or unspecified: Principal | ICD-10-CM | POA: Diagnosis present

## 2023-09-11 DIAGNOSIS — O9081 Anemia of the puerperium: Secondary | ICD-10-CM | POA: Diagnosis not present

## 2023-09-11 DIAGNOSIS — D62 Acute posthemorrhagic anemia: Secondary | ICD-10-CM | POA: Diagnosis not present

## 2023-09-11 LAB — POCT FERN TEST: POCT Fern Test: POSITIVE

## 2023-09-11 MED ORDER — LACTATED RINGERS IV SOLN: INTRAVENOUS | Status: DC

## 2023-09-11 NOTE — MAU Note (Signed)

## 2023-09-11 NOTE — H&P (Signed)
OBSTETRIC ADMISSION HISTORY AND PHYSICAL  Melinda Meyer is a 27 y.o. female G1P0000 with IUP at [redacted]w[redacted]d by early [place] presenting for SROM at 2120 on 09/11/23. She reports +FM, no VB, no blurry vision, headaches or peripheral edema, and RUQ pain.  She plans on breast feeding. She's unsure for birth control. She received her prenatal care at [facility]   Dating: By early [place] --->  Estimated Date of Delivery: 09/23/23  Sono:    @[redacted]w[redacted]d , CWD, normal anatomy, breech presentation, posterior placental lie, 550g, 48% EFW   Prenatal History/Complications:  Patient Active Problem List   Diagnosis Date Noted   Fetal malpresentation 09/08/2023   Breech presentation of fetus 09/08/2023   Skin growth 07/21/2023   Supervision of low-risk pregnancy 02/11/2023   NURSING  PROVIDER  Office Location [facility] Dating by U/S at 7 wks  [facility] Model Mom-Baby Dyad Anatomy U/S Normal, f/u as indicated  Initiated care at  [facility] [employer] [facility]  English               LAB RESULTS   Support Person Shyhiem(FOB), Mom  Genetics NIPS:    low risk          AFP: normal                 NT/IT (FT only) N/a      Carrier Screen Horizon: not done  Rhogam  A/Positive/-- (06/19 1642) A1C/GTT Early: normal             Third trimester: normal  Flu Vaccine Declined      TDaP Vaccine 07/02/23 Blood Type A/Positive/-- (06/19 1642)  Covid Vaccine 2-doses Antibody Negative (06/19 1642)  RSV Vaccine Given 08/02/2023 Rubella 3.05 (06/19 1642)  Feeding Plan Breast RPR Non Reactive (06/19 1642)  Contraception unsure HBsAg Negative (06/19 1642)  Circumcision N/a GIRL  HIV Non Reactive (06/19 1642)  Pediatrician  [facility] HCVAb Non Reactive (06/19 1642)  Prenatal Classes            Pap      Diagnosis  Date Value Ref Range Status  03/10/2023 - Low grade squamous intraepithelial lesion (LSIL) (A)   Final  Needs 1 year repeat  BTLConsent NA GC/CT Initial: neg/neg             36wks:  VBAC  Consent NA GBS For PCN allergy, check  sensitivities            DME Rx Arly.Keller ] BP cuff [ ]  Weight Scale Waterbirth  [ ]  Class [ ]  Consent [ ]  CNM visit  PHQ9 & GAD7 [  ] new OB [ x ] 28 weeks  [  ] 36 weeks Induction  [ ]  Orders Entered [ ] Foley Y/N     Past Medical History: History reviewed. No pertinent past medical history.  Past Surgical History: Past Surgical History:  Procedure Laterality Date   FOOT SURGERY     TONSILLECTOMY      Obstetrical History: OB History     Gravida  1   Para  0   Term  0   Preterm  0   AB  0   Living  0      SAB  0   IAB  0   Ectopic  0   Multiple  0   Live Births  0           Social History  Social History   Socioeconomic History   Marital status: Single    Spouse name: Not on file   Number of children: Not on file   Years of education: Not on file   Highest education level: 12th grade  Occupational History   Not on file  Tobacco Use   Smoking status: Never   Smokeless tobacco: Never  Vaping Use   Vaping status: Former   Quit date: 09/21/2022   Substances: Nicotine, Flavoring  Substance and Sexual Activity   Alcohol use: No   Drug use: No   Sexual activity: Yes    Birth control/protection: None    Comment: Nuva Ring  Other Topics Concern   Not on file  Social History Narrative   Not on file   Social Drivers of Health   Financial Resource Strain: Low Risk  (07/19/2023)   Overall Financial Resource Strain (CARDIA)    Difficulty of Paying Living Expenses: Not hard at all  Food Insecurity: No Food Insecurity (09/08/2023)   Hunger Vital Sign    Worried About Running Out of Food in the Last Year: Never true    Ran Out of Food in the Last Year: Never true  Transportation Needs: No Transportation Needs (09/08/2023)   PRAPARE - Administrator, Civil Service (Medical): No    Lack of Transportation (Non-Medical): No  Physical Activity: Patient Declined (07/19/2023)   Exercise Vital Sign    Days of Exercise per Week: Patient declined     Minutes of Exercise per Session: Patient declined  Stress: No Stress Concern Present (07/19/2023)   [employer] of Occupational Health - Occupational Stress Questionnaire    Feeling of Stress : Not at all  Social Connections: Unknown (07/19/2023)   Social Connection and Isolation Panel [NHANES]    Frequency of Communication with Friends and Family: More than three times a week    Frequency of Social Gatherings with Friends and Family: More than three times a week    Attends Religious Services: Patient declined    Database administrator or Organizations: Patient declined    Attends Engineer, structural: Not on file    Marital Status: Patient declined    Family History: Family History  Problem Relation Age of Onset   Asthma Mother    Diabetes Father    Depression Paternal Grandmother    Cancer Neg Hx    Heart disease Neg Hx    Hypertension Neg Hx     Allergies: No Known Allergies  Medications Prior to Admission  Medication Sig Dispense Refill Last Dose/Taking   Prenatal Vit-Fe Fumarate-FA (MULTIVITAMIN-PRENATAL) 27-0.8 MG TABS tablet Take 1 tablet by mouth daily at 12 noon.        Review of Systems   All systems reviewed and negative except as stated in HPI  Blood pressure 124/87, pulse 95, temperature 98.8 F (37.1 C), resp. rate 16, weight 90 kg, SpO2 99%. General appearance: alert, cooperative, and appears stated age Lungs: clear to auscultation bilaterally Heart: regular rate and rhythm Abdomen: soft, non-tender; bowel sounds normal Pelvic: normal female genitalia  Extremities: Homans sign is negative, no sign of DVT Presentation: breech on BSUS in MAU Fetal monitoringBaseline: 125 bpm, Variability: Good {> 6 bpm), Accelerations: Reactive, and Decelerations: Absent Uterine activity irregular     Prenatal labs: ABO, Rh: --/--/A POS (12/18 1610) Antibody: NEG (12/18 0836) Rubella: 3.05 (06/19 1642) RPR: Non Reactive (10/11 0855)  HBsAg:  Negative (06/19 1642)  HIV: Non Reactive (10/11  1610)  GBS: Negative/-- (12/09 0446)  3 hr Glucola normal Genetic screening  LR NIPS, AFP normal Anatomy US normal   Prenatal Transfer Tool  Maternal Diabetes: No Genetic Screening: Normal Maternal Ultrasounds/Referrals: Normal Fetal Ultrasounds or other Referrals:  Other: Breech  Maternal Substance Abuse:  No Significant Maternal Medications:  None Significant Maternal Lab Results:  Group B Strep negative Number of Prenatal Visits:greater than 3 verified prenatal visits Other Comments:  None  Results for orders placed or performed during the hospital encounter of 09/11/23 (from the past 24 hours)  Fern Test   Collection Time: 09/11/23 11:05 PM  Result Value Ref Range   POCT Fern Test Positive = ruptured amniotic membanes     Patient Active Problem List   Diagnosis Date Noted   Fetal malpresentation 09/08/2023   Breech presentation of fetus 09/08/2023   Skin growth 07/21/2023   Supervision of low-risk pregnancy 02/11/2023    Assessment/Plan:  Melinda Meyer is a 27 y.o. G1P0000 at [redacted]w[redacted]d here for SROM in s/o breech presentation following failed ECV on 09/08/23.   #Labor: Toco quite, last PO intake 1930 09/11/23, will proceed with pLTCS after 0300 12/22.  #Pain: Likely spinal vs epidural  #FWB: Cat1 #ID:  GBS neg #MOF: breast #MOC:unsure    Hessie Dibble, MD  09/11/2023, 11:34 PM

## 2023-09-11 NOTE — MAU Note (Signed)
.  Melinda Meyer is a 27 y.o. at [redacted]w[redacted]d here in MAU reporting: Pt reports she thinks her water broke at 920 pm clear fluid.  Reports +FM  Denies vaginal bleeding   Onset of complaint: tonight 0920 Pain score: 4/10 abdomen  There were no vitals filed for this visit.   Lab orders placed from triage:   none

## 2023-09-12 ENCOUNTER — Inpatient Hospital Stay (HOSPITAL_COMMUNITY): Payer: Managed Care, Other (non HMO) | Admitting: Anesthesiology

## 2023-09-12 ENCOUNTER — Encounter (HOSPITAL_COMMUNITY): Payer: Self-pay | Admitting: Obstetrics and Gynecology

## 2023-09-12 ENCOUNTER — Encounter (HOSPITAL_COMMUNITY)
Admission: AD | Disposition: A | Discharge status: Home / Self Care | Payer: Self-pay | Source: Home / Self Care | Attending: Obstetrics and Gynecology

## 2023-09-12 DIAGNOSIS — Z833 Family history of diabetes mellitus: Secondary | ICD-10-CM | POA: Diagnosis not present

## 2023-09-12 DIAGNOSIS — O9081 Anemia of the puerperium: Secondary | ICD-10-CM | POA: Diagnosis not present

## 2023-09-12 DIAGNOSIS — O26893 Other specified pregnancy related conditions, third trimester: Secondary | ICD-10-CM | POA: Diagnosis present

## 2023-09-12 DIAGNOSIS — O321XX0 Maternal care for breech presentation, not applicable or unspecified: Secondary | ICD-10-CM

## 2023-09-12 DIAGNOSIS — Z98891 History of uterine scar from previous surgery: Principal | ICD-10-CM

## 2023-09-12 DIAGNOSIS — D62 Acute posthemorrhagic anemia: Secondary | ICD-10-CM | POA: Diagnosis not present

## 2023-09-12 DIAGNOSIS — Z3A38 38 weeks gestation of pregnancy: Secondary | ICD-10-CM

## 2023-09-12 DIAGNOSIS — O4202 Full-term premature rupture of membranes, onset of labor within 24 hours of rupture: Secondary | ICD-10-CM | POA: Diagnosis not present

## 2023-09-12 LAB — TYPE AND SCREEN
ABO/RH(D): A POS
Antibody Screen: NEGATIVE

## 2023-09-12 LAB — CBC
HCT: 30.2 % — ABNORMAL LOW (ref 36.0–46.0)
Hemoglobin: 9.8 g/dL — ABNORMAL LOW (ref 12.0–15.0)
MCH: 26.9 pg (ref 26.0–34.0)
MCHC: 32.5 g/dL (ref 30.0–36.0)
MCV: 83 fL (ref 80.0–100.0)
Platelets: 312 10*3/uL (ref 150–400)
RBC: 3.64 MIL/uL — ABNORMAL LOW (ref 3.87–5.11)
RDW: 14.1 % (ref 11.5–15.5)
WBC: 14.8 10*3/uL — ABNORMAL HIGH (ref 4.0–10.5)
nRBC: 0 % (ref 0.0–0.2)

## 2023-09-12 LAB — CREATININE, SERUM
Creatinine, Ser: 0.53 mg/dL (ref 0.44–1.00)
GFR, Estimated: >60 mL/min (ref >60)

## 2023-09-12 LAB — RPR: RPR Ser Ql: NONREACTIVE

## 2023-09-12 SURGERY — Surgical Case
Anesthesia: Spinal

## 2023-09-12 MED ORDER — MEDROXYPROGESTERONE ACETATE 150 MG/ML IM SUSP
150.0000 mg | INTRAMUSCULAR | Status: DC | PRN
Start: 2023-09-12 — End: 2023-09-14

## 2023-09-12 MED ORDER — IBUPROFEN 600 MG PO TABS
600.0000 mg | ORAL_TABLET | Freq: Four times a day (QID) | ORAL | Status: DC
  Administered 2023-09-13 – 2023-09-14 (×4): 600 mg via ORAL
  Filled 2023-09-12 (×5): qty 1

## 2023-09-12 MED ORDER — LACTATED RINGERS IV SOLN: INTRAVENOUS | Status: AC

## 2023-09-12 MED ORDER — DIBUCAINE (PERIANAL) 1 % EX OINT: 1.0000 | TOPICAL_OINTMENT | CUTANEOUS | Status: DC | PRN

## 2023-09-12 MED ORDER — CEFAZOLIN SODIUM-DEXTROSE 2-3 GM-%(50ML) IV SOLR
INTRAVENOUS | Status: DC | PRN
  Administered 2023-09-12: 2 g via INTRAVENOUS

## 2023-09-12 MED ORDER — SODIUM CHLORIDE 0.9 % IR SOLN
Status: DC | PRN
  Administered 2023-09-12: 1

## 2023-09-12 MED ORDER — TETANUS-DIPHTH-ACELL PERTUSSIS 5-2.5-18.5 LF-MCG/0.5 IM SUSY: 0.5000 mL | PREFILLED_SYRINGE | Freq: Once | INTRAMUSCULAR | Status: DC

## 2023-09-12 MED ORDER — KETOROLAC TROMETHAMINE 30 MG/ML IJ SOLN
INTRAMUSCULAR | Status: DC | PRN
  Administered 2023-09-12: 30 mg via INTRAVENOUS

## 2023-09-12 MED ORDER — SOD CITRATE-CITRIC ACID 500-334 MG/5ML PO SOLN
30.0000 mL | Freq: Once | ORAL | Status: AC
  Administered 2023-09-12: 30 mL via ORAL
  Filled 2023-09-12: qty 30

## 2023-09-12 MED ORDER — PRENATAL MULTIVITAMIN CH
1.0000 | ORAL_TABLET | Freq: Every day | ORAL | Status: DC
  Administered 2023-09-12 – 2023-09-13 (×2): 1 via ORAL
  Filled 2023-09-12 (×2): qty 1

## 2023-09-12 MED ORDER — SIMETHICONE 80 MG PO CHEW
80.0000 mg | CHEWABLE_TABLET | Freq: Three times a day (TID) | ORAL | Status: DC
  Administered 2023-09-12 – 2023-09-14 (×5): 80 mg via ORAL
  Filled 2023-09-12 (×5): qty 1

## 2023-09-12 MED ORDER — NALOXONE HCL 4 MG/10ML IJ SOLN: 1.0000 ug/kg/h | INTRAVENOUS | Status: DC | PRN

## 2023-09-12 MED ORDER — MORPHINE SULFATE (PF) 0.5 MG/ML IJ SOLN
INTRAMUSCULAR | Status: AC
  Filled 2023-09-12: qty 10

## 2023-09-12 MED ORDER — SODIUM CHLORIDE 0.9% FLUSH: 3.0000 mL | INTRAVENOUS | Status: DC | PRN

## 2023-09-12 MED ORDER — KETOROLAC TROMETHAMINE 30 MG/ML IJ SOLN: 30.0000 mg | Freq: Four times a day (QID) | INTRAMUSCULAR | Status: DC | PRN

## 2023-09-12 MED ORDER — WITCH HAZEL-GLYCERIN EX PADS: 1.0000 | MEDICATED_PAD | CUTANEOUS | Status: DC | PRN

## 2023-09-12 MED ORDER — MEASLES, MUMPS & RUBELLA VAC IJ SOLR: 0.5000 mL | Freq: Once | INTRAMUSCULAR | Status: DC

## 2023-09-12 MED ORDER — ACETAMINOPHEN 10 MG/ML IV SOLN: 1000.0000 mg | Freq: Once | INTRAVENOUS | Status: DC | PRN

## 2023-09-12 MED ORDER — MORPHINE SULFATE (PF) 0.5 MG/ML IJ SOLN
INTRAMUSCULAR | Status: DC | PRN
  Administered 2023-09-12: 150 ug via INTRATHECAL

## 2023-09-12 MED ORDER — OXYTOCIN-SODIUM CHLORIDE 30-0.9 UT/500ML-% IV SOLN
INTRAVENOUS | Status: DC | PRN
  Administered 2023-09-12 (×2): 30 [IU] via INTRAVENOUS

## 2023-09-12 MED ORDER — FENTANYL CITRATE (PF) 100 MCG/2ML IJ SOLN
INTRAMUSCULAR | Status: AC
  Filled 2023-09-12: qty 2

## 2023-09-12 MED ORDER — BUPIVACAINE IN DEXTROSE 0.75-8.25 % IT SOLN
INTRATHECAL | Status: DC | PRN
  Administered 2023-09-12: 1.6 mL via INTRATHECAL

## 2023-09-12 MED ORDER — ENOXAPARIN SODIUM 40 MG/0.4ML IJ SOSY
40.0000 mg | PREFILLED_SYRINGE | INTRAMUSCULAR | Status: DC
  Administered 2023-09-12 – 2023-09-13 (×2): 40 mg via SUBCUTANEOUS
  Filled 2023-09-12 (×2): qty 0.4

## 2023-09-12 MED ORDER — SCOPOLAMINE 1 MG/3DAYS TD PT72
1.0000 | MEDICATED_PATCH | Freq: Once | TRANSDERMAL | Status: DC
  Administered 2023-09-12: 1.5 mg via TRANSDERMAL
  Filled 2023-09-12: qty 1

## 2023-09-12 MED ORDER — DEXMEDETOMIDINE HCL IN NACL 80 MCG/20ML IV SOLN
INTRAVENOUS | Status: DC | PRN
  Administered 2023-09-12: 12 ug via INTRAVENOUS
  Administered 2023-09-12: 8 ug via INTRAVENOUS

## 2023-09-12 MED ORDER — KETOROLAC TROMETHAMINE 30 MG/ML IJ SOLN
30.0000 mg | Freq: Four times a day (QID) | INTRAMUSCULAR | Status: AC
  Administered 2023-09-12 – 2023-09-13 (×4): 30 mg via INTRAVENOUS
  Filled 2023-09-12 (×4): qty 1

## 2023-09-12 MED ORDER — FENTANYL CITRATE (PF) 100 MCG/2ML IJ SOLN
INTRAMUSCULAR | Status: DC | PRN
  Administered 2023-09-12: 85 ug via INTRAVENOUS
  Administered 2023-09-12 (×2): 50 ug via INTRAVENOUS

## 2023-09-12 MED ORDER — NALOXONE HCL 0.4 MG/ML IJ SOLN: 0.4000 mg | INTRAMUSCULAR | Status: DC | PRN

## 2023-09-12 MED ORDER — OXYTOCIN-SODIUM CHLORIDE 30-0.9 UT/500ML-% IV SOLN
2.5000 [IU]/h | INTRAVENOUS | Status: AC
  Administered 2023-09-12: 2.5 [IU]/h via INTRAVENOUS
  Filled 2023-09-12: qty 500

## 2023-09-12 MED ORDER — FENTANYL CITRATE (PF) 100 MCG/2ML IJ SOLN
25.0000 ug | INTRAMUSCULAR | Status: DC | PRN
  Administered 2023-09-12: 25 ug via INTRAVENOUS

## 2023-09-12 MED ORDER — ACETAMINOPHEN 500 MG PO TABS: 1000.0000 mg | ORAL_TABLET | Freq: Four times a day (QID) | ORAL | Status: DC

## 2023-09-12 MED ORDER — DEXAMETHASONE SODIUM PHOSPHATE 10 MG/ML IJ SOLN
INTRAMUSCULAR | Status: AC
Start: 2023-09-12 — End: ?
  Filled 2023-09-12: qty 1

## 2023-09-12 MED ORDER — SIMETHICONE 80 MG PO CHEW: 80.0000 mg | CHEWABLE_TABLET | ORAL | Status: DC | PRN

## 2023-09-12 MED ORDER — OXYCODONE HCL 5 MG PO TABS
5.0000 mg | ORAL_TABLET | ORAL | Status: DC | PRN
Start: 2023-09-12 — End: 2023-09-14
  Administered 2023-09-13 – 2023-09-14 (×2): 5 mg via ORAL
  Filled 2023-09-12 (×2): qty 1

## 2023-09-12 MED ORDER — DIPHENHYDRAMINE HCL 50 MG/ML IJ SOLN: 12.5000 mg | INTRAMUSCULAR | Status: DC | PRN

## 2023-09-12 MED ORDER — MENTHOL 3 MG MT LOZG: 1.0000 | LOZENGE | OROMUCOSAL | Status: DC | PRN

## 2023-09-12 MED ORDER — MAGNESIUM HYDROXIDE 400 MG/5ML PO SUSP: 30.0000 mL | ORAL | Status: DC | PRN

## 2023-09-12 MED ORDER — ACETAMINOPHEN 500 MG PO TABS
1000.0000 mg | ORAL_TABLET | Freq: Four times a day (QID) | ORAL | Status: DC
  Administered 2023-09-12 – 2023-09-14 (×8): 1000 mg via ORAL
  Filled 2023-09-12 (×9): qty 2

## 2023-09-12 MED ORDER — DEXAMETHASONE SODIUM PHOSPHATE 10 MG/ML IJ SOLN
INTRAMUSCULAR | Status: DC | PRN
  Administered 2023-09-12: 10 mg via INTRAVENOUS

## 2023-09-12 MED ORDER — ONDANSETRON HCL 4 MG/2ML IJ SOLN: 4.0000 mg | Freq: Three times a day (TID) | INTRAMUSCULAR | Status: DC | PRN

## 2023-09-12 MED ORDER — ZOLPIDEM TARTRATE 5 MG PO TABS
5.0000 mg | ORAL_TABLET | Freq: Every evening | ORAL | Status: DC | PRN
Start: 2023-09-12 — End: 2023-09-14

## 2023-09-12 MED ORDER — FENTANYL CITRATE (PF) 100 MCG/2ML IJ SOLN
INTRAMUSCULAR | Status: DC | PRN
  Administered 2023-09-12: 15 ug via INTRATHECAL

## 2023-09-12 MED ORDER — OXYTOCIN-SODIUM CHLORIDE 30-0.9 UT/500ML-% IV SOLN
INTRAVENOUS | Status: AC
  Filled 2023-09-12: qty 1000

## 2023-09-12 MED ORDER — COCONUT OIL OIL
1.0000 | TOPICAL_OIL | Status: DC | PRN
  Administered 2023-09-12: 1 via TOPICAL

## 2023-09-12 MED ORDER — FENTANYL CITRATE (PF) 100 MCG/2ML IJ SOLN
50.0000 ug | Freq: Once | INTRAMUSCULAR | Status: AC
  Administered 2023-09-12: 50 ug via INTRAVENOUS
  Filled 2023-09-12: qty 2

## 2023-09-12 MED ORDER — ONDANSETRON HCL 4 MG/2ML IJ SOLN
INTRAMUSCULAR | Status: DC | PRN
  Administered 2023-09-12: 4 mg via INTRAVENOUS

## 2023-09-12 MED ORDER — SENNOSIDES-DOCUSATE SODIUM 8.6-50 MG PO TABS
2.0000 | ORAL_TABLET | Freq: Every day | ORAL | Status: DC
  Administered 2023-09-13 – 2023-09-14 (×2): 2 via ORAL
  Filled 2023-09-12 (×2): qty 2

## 2023-09-12 MED ORDER — PHENYLEPHRINE HCL-NACL 20-0.9 MG/250ML-% IV SOLN
INTRAVENOUS | Status: DC | PRN
  Administered 2023-09-12: 60 ug/min via INTRAVENOUS

## 2023-09-12 MED ORDER — GABAPENTIN 100 MG PO CAPS
200.0000 mg | ORAL_CAPSULE | Freq: Every day | ORAL | Status: DC
  Administered 2023-09-12 – 2023-09-13 (×2): 200 mg via ORAL
  Filled 2023-09-12 (×2): qty 2

## 2023-09-12 MED ORDER — DIPHENHYDRAMINE HCL 25 MG PO CAPS
25.0000 mg | ORAL_CAPSULE | Freq: Four times a day (QID) | ORAL | Status: DC | PRN
Start: 2023-09-12 — End: 2023-09-12

## 2023-09-12 MED ORDER — DIPHENHYDRAMINE HCL 25 MG PO CAPS: 25.0000 mg | ORAL_CAPSULE | ORAL | Status: DC | PRN

## 2023-09-12 MED ORDER — ACETAMINOPHEN 10 MG/ML IV SOLN
INTRAVENOUS | Status: DC | PRN
Start: 2023-09-12 — End: 2023-09-12
  Administered 2023-09-12: 1000 mg via INTRAVENOUS

## 2023-09-12 MED ORDER — CEFAZOLIN SODIUM-DEXTROSE 2-4 GM/100ML-% IV SOLN
INTRAVENOUS | Status: AC
  Filled 2023-09-12: qty 100

## 2023-09-12 SURGICAL SUPPLY — 30 items
BENZOIN TINCTURE PRP APPL 2/3 (GAUZE/BANDAGES/DRESSINGS) IMPLANT
CHLORAPREP W/TINT 26 (MISCELLANEOUS) ×2 IMPLANT
CLAMP UMBILICAL CORD (MISCELLANEOUS) ×1 IMPLANT
CLOTH BEACON ORANGE TIMEOUT ST (SAFETY) ×1 IMPLANT
DRSG OPSITE POSTOP 4X10 (GAUZE/BANDAGES/DRESSINGS) ×1 IMPLANT
ELECT REM PT RETURN 9FT ADLT (ELECTROSURGICAL) ×1
ELECTRODE REM PT RTRN 9FT ADLT (ELECTROSURGICAL) ×1 IMPLANT
EXTRACTOR VACUUM M CUP 4 TUBE (SUCTIONS) IMPLANT
GAUZE PAD ABD 7.5X8 STRL (GAUZE/BANDAGES/DRESSINGS) IMPLANT
GAUZE SPONGE 4X4 12PLY STRL LF (GAUZE/BANDAGES/DRESSINGS) IMPLANT
GLOVE BIOGEL PI IND STRL 7.0 (GLOVE) ×2 IMPLANT
GLOVE BIOGEL PI IND STRL 8 (GLOVE) ×1 IMPLANT
GLOVE ECLIPSE 7.5 STRL STRAW (GLOVE) ×1 IMPLANT
GOWN STRL REUS W/TWL LRG LVL3 (GOWN DISPOSABLE) ×2 IMPLANT
GOWN STRL REUS W/TWL XL LVL3 (GOWN DISPOSABLE) ×1 IMPLANT
KIT ABG SYR 3ML LUER SLIP (SYRINGE) IMPLANT
NDL HYPO 25X5/8 SAFETYGLIDE (NEEDLE) IMPLANT
NEEDLE HYPO 25X5/8 SAFETYGLIDE (NEEDLE) IMPLANT
NS IRRIG 1000ML POUR BTL (IV SOLUTION) ×1 IMPLANT
PACK C SECTION WH (CUSTOM PROCEDURE TRAY) ×1 IMPLANT
PAD OB MATERNITY 4.3X12.25 (PERSONAL CARE ITEMS) ×1 IMPLANT
RTRCTR C-SECT PINK 25CM LRG (MISCELLANEOUS) ×1 IMPLANT
STRIP CLOSURE SKIN 1/2X4 (GAUZE/BANDAGES/DRESSINGS) IMPLANT
SUT MNCRL 0 VIOLET CTX 36 (SUTURE) ×2 IMPLANT
SUT VIC AB 0 CTX36XBRD ANBCTRL (SUTURE) ×1 IMPLANT
SUT VIC AB 2-0 CT1 TAPERPNT 27 (SUTURE) ×1 IMPLANT
SUT VIC AB 4-0 KS 27 (SUTURE) ×1 IMPLANT
TOWEL OR 17X24 6PK STRL BLUE (TOWEL DISPOSABLE) ×1 IMPLANT
TRAY FOLEY W/BAG SLVR 14FR LF (SET/KITS/TRAYS/PACK) ×1 IMPLANT
WATER STERILE IRR 1000ML POUR (IV SOLUTION) ×1 IMPLANT

## 2023-09-12 NOTE — Lactation Note (Signed)
This note was copied from a baby's chart. Lactation Consultation Note  Patient Name: Melinda Meyer [id number] Date: 09/12/2023 Age:25 hours Reason for consult: Initial assessment;Primapara;1st time breastfeeding;Early term 37-38.6wks;RN request  P1- RN requested for LC to see MOB. MOB reports that infant last nursed for 15 minutes about an hour ago. MOB denies experiencing any pain or discomfort when infant nurses. MOB is concerned that she does not have mature milk right now. LC reviewed the difference between colostrum and mature milk. LC offered to hand express MOB to demonstrate how she has colostrum. MOB agreed. LC demonstrated hand expression on the left breast and noted two small drops of colostrum after two expressions. MOB verbalized relief. LC encouraged MOB to call for a feeding so we could perform a latch assessment.  LC reviewed the 24 hr birthday nap, feeding infant on cue 8-12x in 24 hrs, not allowing infant to go over 3 hrs without a feeding, CDC milk storage guidelines and LC services handout. LC encouraged MOB to call for further assistance as needed.  Maternal Data Has patient been taught Hand Expression?: Yes Does the patient have breastfeeding experience prior to this delivery?: No  Feeding Mother's Current Feeding Choice: Breast Milk  Lactation Tools Discussed/Used Tools: Pump;Flanges Breast pump type: Manual Pump Education: Setup, frequency, and cleaning;Milk Storage Reason for Pumping: RN provided one to MOB Pumping frequency: 15-20 min every 2-3 hrs as needed  Interventions Interventions: Breast feeding basics reviewed;Hand express;Expressed milk;Education;LC Services brochure  Discharge Discharge Education: Warning signs for feeding baby Pump: DEBP;Personal  Consult Status Consult Status: Follow-up Date: 09/13/23 Follow-up type: In-patient    Dema Severin BS, IBCLC 09/12/2023, 12:37 PM

## 2023-09-12 NOTE — Discharge Summary (Signed)
Postpartum Discharge Summary  Date of Service updated***     Patient Name: Melinda Meyer DOB: Nov 21, 1995 MRN: [id number]  Date of admission: 09/11/2023 Delivery date:09/12/2023 Delivering provider: Warden Fillers Date of discharge: 09/12/2023  Admitting diagnosis: ctx and water just broke Intrauterine pregnancy: [redacted]w[redacted]d     Secondary diagnosis:  Active Problems:   * No active hospital problems. *  Additional problems: ***    Discharge diagnosis: Term Pregnancy Delivered                                              Post partum procedures:{Postpartum procedures:23558} Augmentation: N/A Complications: None  Hospital course: Onset of Labor With Unplanned C/S   27 y.o. yo G1P1001 at [redacted]w[redacted]d was admitted in Latent Labor on 09/11/2023 following SROM at home in s/o of breech presentation. Patient had a labor course significant for confirmed breech presentation upon arrival. The patient went for cesarean section due to Malpresentation. Delivery details as follows: Membrane Rupture Time/Date: 9:20 PM,09/11/2023  Delivery Method:C-Section, Low Transverse Operative Delivery:N/A Details of operation can be found in separate operative note. Patient had a postpartum course complicated by***.  She is ambulating,tolerating a regular diet, passing flatus, and urinating well.  Patient is discharged home in stable condition 09/12/23.  Newborn Data: Birth date:09/12/2023 Birth time:5:40 AM Gender:Female Living status:Living Apgars:9 ,9  Weight:3090 g  Magnesium Sulfate received: {Mag received:30440022} BMZ received: No Rhophylac:N/A MMR:N/A T-DaP:Given prenatally Flu: declined prenatally  RSV Vaccine received: Given prenatally  Transfusion:{Transfusion received:30440034}  Immunizations received: Immunization History  Administered Date(s) Administered   DTaP 10/30/1998, 01/09/1999, 03/20/1999, 04/29/2000, 02/20/2003   Dtap, Unspecified 02/20/2003   HIB (PRP-OMP) 10/30/1998,  01/09/1999   HIB (PRP-T) 10/30/1998, 01/09/1999, 06/24/1999, 06/24/1999, 04/29/2000, 04/29/2000   Hep B, Unspecified 01/09/1999, 03/20/1999, 06/24/1999   Hepatitis B, PED/ADOLESCENT 01/09/1999, 03/20/1999, 06/24/1999   IPV 10/30/1998, 01/09/1999, 12/02/1999, 02/20/2003   MMR 12/02/1999, 02/20/2003   Meningococcal Acwy, Unspecified 02/02/2012   Meningococcal Conjugate 02/02/2012, 02/02/2012, 01/04/2017   PFIZER(Purple Top)SARS-COV-2 Vaccination 07/09/2020, 07/30/2020   Pneumococcal Conjugate PCV 7 12/02/1999, 02/05/2000, 02/04/2006   Pneumococcal Conjugate-13 12/02/1999, 02/04/2006   Polio, Unspecified 02/20/2003   Rsv, Bivalent, Protein Subunit Rsvpref,pf Verdis Frederickson) 08/02/2023   Tdap 05/28/2010, 01/04/2017, 12/19/2018, 07/02/2023   Varicella 12/02/1999, 01/11/2007    Physical exam  Vitals:   09/11/23 2250 09/11/23 2251 09/11/23 2255 09/12/23 0650  BP:  124/87    Pulse:  95    Resp: 16     Temp: 98.8 F (37.1 C)   98.5 F (36.9 C)  TempSrc:    Oral  SpO2: 99%     Weight:   90 kg    General: {Exam; general:21111117} Lochia: {Desc; appropriate/inappropriate:30686::"appropriate"} Uterine Fundus: {Desc; firm/soft:30687} Incision: {Exam; incision:21111123} DVT Evaluation: {Exam; dvt:2111122} Labs: Lab Results  Component Value Date   WBC 14.8 (H) 09/12/2023   HGB 9.8 (L) 09/12/2023   HCT 30.2 (L) 09/12/2023   MCV 83.0 09/12/2023   PLT 312 09/12/2023       No data to display         Edinburgh Score:     No data to display         No data recorded  After visit meds:  Allergies as of 09/12/2023   No Known Allergies   Med Rec must be completed prior to using this [facility]***  Discharge home in stable condition Infant Feeding: {Baby feeding:23562} Infant Disposition:{CHL IP OB HOME WITH WUJWJX:91478} Discharge instruction: per After Visit Summary and Postpartum booklet. Activity: Advance as tolerated. Pelvic rest for 6 weeks.  Diet: {OB  GNFA:21308657} Future Appointments: Future Appointments  Date Time Provider Department Center  09/13/2023  3:55 PM Celedonio Savage, MD [facility]  09/16/2023  9:30 AM [facility] PAT 1 [facility] None  09/20/2023  3:35 PM Federico Flake, MD [facility]   Follow up Visit: [facility] 12/22  Please schedule this patient for a In person postpartum visit in 4 weeks with the following provider: Any provider. Additional Postpartum F/U:Incision check 1 week  Low risk pregnancy complicated by:  breech presentation after PROM and previously failed ECV Delivery mode:  C-Section, Low Transverse Anticipated Birth Control:  Unsure   09/12/2023 Hessie Dibble, MD

## 2023-09-12 NOTE — Op Note (Signed)
Melinda Meyer PROCEDURE DATE: 09/12/2023  PREOPERATIVE DIAGNOSES: Intrauterine pregnancy at [redacted]w[redacted]d weeks gestation; malpresentation: breech, SROM  POSTOPERATIVE DIAGNOSES: The same, viable infant delivered  PROCEDURE: PrimaryLow Transverse Cesarean Section  SURGEON:  Dr. Hazle Coca   ASSISTANT:  Hessie Dibble, MD An experienced assistant was required given the standard of surgical care given the complexity of the case.  This assistant was needed for exposure, dissection, suctioning, retraction, instrument exchange, assisting with delivery with administration of fundal pressure, and for overall help during the procedure.  ANESTHESIOLOGY TEAM: Anesthesiologist: Elmer Picker, MD CRNA: Sheppard Evens, CRNA  INDICATIONS: Melinda Meyer is a 27 y.o. G1P1001 at [redacted]w[redacted]d here for cesarean section secondary to the indications listed under preoperative diagnoses; please see preoperative note for further details.  The risks of surgery were discussed with the patient including but were not limited to: bleeding which may require transfusion or reoperation; infection which may require antibiotics; injury to bowel, bladder, ureters or other surrounding organs; injury to the fetus; need for additional procedures including hysterectomy in the event of a life-threatening hemorrhage; formation of adhesions; placental abnormalities wth subsequent pregnancies; incisional problems; thromboembolic phenomenon and other postoperative/anesthesia complications.  The patient concurred with the proposed plan, giving informed written consent for the procedure.    FINDINGS:  Viable female infant in cephalic presentation.  Apgars 9 and 9.  Amniotic fluid: clear.  Intact placenta, three vessel cord.  Normal uterus, fallopian tubes and ovaries bilaterally.  ANESTHESIA: spinal INTRAVENOUS FLUIDS: 1200 ml   ESTIMATED BLOOD LOSS: 400 ml URINE OUTPUT:  100 ml SPECIMENS: Placenta sent to L&D . COMPLICATIONS: None  immediate  PROCEDURE IN DETAIL:  The patient preoperatively received intravenous antibiotics and had sequential compression devices applied to her lower extremities.  She was then taken to the operating room where spinal anesthesia was found to be adequate. She was then placed in a dorsal supine position with a leftward tilt, and prepped and draped in a sterile manner.  A foley catheter was  placed into her bladder and attached to constant gravity.  After an adequate timeout was performed, a Pfannenstiel skin incision was made with scalpel and carried through to the underlying layer of fascia. The fascia was incised in the midline, and this incision was extended sharply with mayo scissors. The rectus muscles were separated in the midline and the peritoneum was entered bluntly.   The Alexis self-retaining retractor was introduced into the abdominal cavity.  Attention was turned to the lower uterine segment where a low transverse hysterotomy was made with a scalpel and extended bluntly in caudad and cephalad directions.  The infant was successfully delivered, the cord was clamped and cut after one minute, and the infant was handed over to the awaiting neonatology team. Uterine massage was then administered, and the placenta delivered intact with a three-vessel cord. The uterus was then cleared of clots and debris.  The hysterotomy was closed with 0-Monocryl in a running fashion.  One Figure-of-eight 0-Monocryl serosal stitches were placed to help with hemostasis.    The pelvis was cleared of all clot and debris. Hemostasis was confirmed on all surfaces. The uterus incision was once again inspected and found to be hemostatic. The retractor was removed.  The peritoneum closure started with a 2-0 Vicryl running stitch had to initially be removed as it was found to have fibers of surgical lab locked into the apex knot.  Confirmed removal of both initial suture and fibers of cloth prior to peritoneum closed with  new 2-0 Vicryl running stitch. There The fascia was then closed using 0 Vicryl in a running fashion.  The subcutaneous layer was irrigated, any areas of bleeding were cauterized with the bovie,  was found to be hemostatic.. . The skin was closed with a 4-0 Monocryl subcuticular stitch. The patient tolerated the procedure well. Sponge, instrument and needle counts were correct x 3.  She was taken to the recovery room in stable condition.   Hessie Dibble, MD FMOB Fellow, Faculty practice [facility], Center for [facility] 09/12/23  6:50 AM

## 2023-09-12 NOTE — Anesthesia Preprocedure Evaluation (Signed)
Anesthesia Evaluation  Patient identified by MRN, date of birth, ID band Patient awake    Reviewed: Allergy & Precautions, NPO status , Patient's Chart, lab work & pertinent test results  Airway Mallampati: II  TM Distance: >3 FB Neck ROM: Full    Dental no notable dental hx.    Pulmonary neg pulmonary ROS   Pulmonary exam normal breath sounds clear to auscultation       Cardiovascular negative cardio ROS Normal cardiovascular exam Rhythm:Regular Rate:Normal     Neuro/Psych negative neurological ROS  negative psych ROS   GI/Hepatic negative GI ROS, Neg liver ROS,,,  Endo/Other  negative endocrine ROS    Renal/GU negative Renal ROS  negative genitourinary   Musculoskeletal negative musculoskeletal ROS (+)    Abdominal   Peds  Hematology  (+) Blood dyscrasia, anemia Lab Results      Component                Value               Date                      WBC                      14.8 (H)            09/12/2023                HGB                      9.8 (L)             09/12/2023                HCT                      30.2 (L)            09/12/2023                MCV                      83.0                09/12/2023                PLT                      312                 09/12/2023              Anesthesia Other Findings Primary C/S for breech and SROM  Reproductive/Obstetrics (+) Pregnancy                             Anesthesia Physical Anesthesia Plan  ASA: 2  Anesthesia Plan: Spinal   Post-op Pain Management:    Induction:   PONV Risk Score and Plan: Treatment may vary due to age or medical condition  Airway Management Planned: Natural Airway  Additional Equipment:   Intra-op Plan:   Post-operative Plan:   Informed Consent: I have reviewed the patients History and Physical, chart, labs and discussed the procedure including the risks, benefits and alternatives for the  proposed anesthesia with the patient or authorized representative who has indicated his/her understanding and acceptance.  Dental advisory given  Plan Discussed with: CRNA  Anesthesia Plan Comments:        Anesthesia Quick Evaluation

## 2023-09-12 NOTE — Anesthesia Procedure Notes (Signed)
Spinal  Patient location during procedure: OR Start time: 09/12/2023 5:08 AM End time: 09/12/2023 5:10 AM Reason for block: surgical anesthesia Staffing Performed: anesthesiologist  Anesthesiologist: Elmer Picker, MD Performed by: Elmer Picker, MD Authorized by: Elmer Picker, MD   Preanesthetic Checklist Completed: patient identified, IV checked, risks and benefits discussed, surgical consent, monitors and equipment checked, pre-op evaluation and timeout performed Spinal Block Patient position: sitting Prep: DuraPrep and site prepped and draped Patient monitoring: cardiac monitor, continuous pulse ox and blood pressure Approach: midline Location: L3-4 Injection technique: single-shot Needle Needle type: Pencan  Needle gauge: 24 G Needle length: 9 cm Assessment Sensory level: T6 Events: CSF return Additional Notes Functioning IV was confirmed and monitors were applied. Sterile prep and drape, including hand hygiene and sterile gloves were used. The patient was positioned and the spine was prepped. The skin was anesthetized with lidocaine.  Free flow of clear CSF was obtained prior to injecting local anesthetic into the CSF.  The spinal needle aspirated freely following injection.  The needle was carefully withdrawn.  The patient tolerated the procedure well.

## 2023-09-12 NOTE — Transfer of Care (Signed)
Immediate Anesthesia Transfer of Care Note  Patient: Melinda Meyer  Procedure(s) Performed: CESAREAN SECTION  Patient Location: PACU  Anesthesia Type:Spinal  Level of Consciousness: awake, alert , and oriented  Airway & Oxygen Therapy: Patient Spontanous Breathing  Post-op Assessment: Report given to RN and Post -op Vital signs reviewed and stable  Post vital signs: Reviewed and stable  Last Vitals:  Vitals Value Taken Time  BP 125/56 09/12/23 0649  Temp    Pulse 71 09/12/23 0651  Resp    SpO2 95 % 09/12/23 0651  Vitals shown include unfiled device data.  Last Pain:  Vitals:   09/12/23 0424  PainSc: 7          Complications: No notable events documented.

## 2023-09-12 NOTE — Anesthesia Postprocedure Evaluation (Signed)
Anesthesia Post Note  Patient: Melinda Meyer  Procedure(s) Performed: CESAREAN SECTION     Patient location during evaluation: PACU Anesthesia Type: Spinal Level of consciousness: oriented and awake and alert Pain management: pain level controlled Vital Signs Assessment: post-procedure vital signs reviewed and stable Respiratory status: spontaneous breathing, respiratory function stable and patient connected to nasal cannula oxygen Cardiovascular status: blood pressure returned to baseline and stable Postop Assessment: no headache, no backache and no apparent nausea or vomiting Anesthetic complications: no  No notable events documented.  Last Vitals:  Vitals:   09/12/23 1043 09/12/23 1403  BP: 118/60 114/68  Pulse: 60 63  Resp: 18 18  Temp: 36.7 C 36.8 C  SpO2: 97% 98%    Last Pain:  Vitals:   09/12/23 1403  TempSrc: Oral  PainSc: 0-No pain                 Izzy Doubek L Magnus Crescenzo

## 2023-09-13 ENCOUNTER — Encounter: Payer: Managed Care, Other (non HMO) | Admitting: Family Medicine

## 2023-09-13 LAB — CBC
HCT: 24.1 % — ABNORMAL LOW (ref 36.0–46.0)
Hemoglobin: 7.6 g/dL — ABNORMAL LOW (ref 12.0–15.0)
MCH: 26.4 pg (ref 26.0–34.0)
MCHC: 31.5 g/dL (ref 30.0–36.0)
MCV: 83.7 fL (ref 80.0–100.0)
Platelets: 267 10*3/uL (ref 150–400)
RBC: 2.88 MIL/uL — ABNORMAL LOW (ref 3.87–5.11)
RDW: 14 % (ref 11.5–15.5)
WBC: 16.8 10*3/uL — ABNORMAL HIGH (ref 4.0–10.5)
nRBC: 0 % (ref 0.0–0.2)

## 2023-09-13 MED ORDER — FERROUS SULFATE 325 (65 FE) MG PO TABS
325.0000 mg | ORAL_TABLET | ORAL | Status: DC
  Administered 2023-09-14: 325 mg via ORAL
  Filled 2023-09-13: qty 1

## 2023-09-13 MED ORDER — SODIUM CHLORIDE 0.9 % IV SOLN
500.0000 mg | Freq: Once | INTRAVENOUS | Status: AC
  Administered 2023-09-13: 500 mg via INTRAVENOUS
  Filled 2023-09-13: qty 25

## 2023-09-13 NOTE — Progress Notes (Signed)
POSTPARTUM PROGRESS NOTE  POD #1  Subjective:  Melinda Meyer is a 27 y.o. G1P1001 s/p primary LTCS at [redacted]w[redacted]d. No acute events overnight. She reports she is doing well. She denies any problems with ambulating, voiding or po intake. Denies nausea or vomiting. She has not passed flatus. Pain is well controlled.  Lochia is minimal.  Objective: Blood pressure (!) 98/52, pulse (!) 58, temperature 97.9 F (36.6 C), temperature source Oral, resp. rate 18, weight 90 kg, SpO2 97%, unknown if currently breastfeeding.  Physical Exam:  General: alert, cooperative and no distress Chest: no respiratory distress Heart: regular rate, distal pulses intact Uterine Fundus: firm, appropriately tender DVT Evaluation: No calf swelling or tenderness Extremities: Trace bilateral lower extremity edema Skin: warm, dry; incision clean/dry/intact w/ pressure dressing in place  Recent Labs    09/12/23 0037 09/13/23 0457  HGB 9.8* 7.6*  HCT 30.2* 24.1*    Assessment/Plan: Melinda Meyer is a 27 y.o. G1P1001 s/p pLTCS for fetal malpresentation at [redacted]w[redacted]d.  POD#1 - Doing welll; pain well controlled. H/H appropriate  Routine postpartum care  OOB, ambulated  Lovenox for VTE prophylaxis  Acute Postoperative Blood Loss Anemia: asymptomatic, clinically significant  IV iron x 1 dose, then start po ferrous sulfate every other day  Contraception: Unsure Feeding: breast  Dispo: Plan for discharge tomorrow AM per pt request.   LOS: 1 day   Sundra Aland, MD OB Fellow  09/13/2023, 3:05 PM

## 2023-09-13 NOTE — Progress Notes (Signed)
Pt. Will be having visitors and wanted to know if ok to get iron tonight when going to sleep tonight. Resident call Gerilyn Pilgrim) stated to this nurse he was okay with that.

## 2023-09-13 NOTE — Lactation Note (Signed)
This note was copied from a baby's chart. Lactation Consultation Note  Patient Name: Girl Marzia Tripplett [id number] Date: 09/13/2023 Age:25 hours Reason for consult: Follow-up assessment;Primapara;1st time breastfeeding;Early term 37-38.6wks;Breastfeeding assistance;Nipple pain/trauma  P1- MOB reports that infant has caused nipple damage to her left nipple but not her right. MOB believes that infant is not able to get her entire nipple in her mouth. LC noted a large blood blister on the top of MOB's left nipple. MOB states that she has been using coconut oil to help heal it and feeding infant DBM because of the pain. MOB has been set up with a DEBP by the RN once DBM was initiated.   Infant was in the middle of eating DBM with grandma, but LC offered to assist with a latch. MOB agreed. LC placed infant on the right breast in the football hold. LC reviewed belly to belly and nose to nipple positioning. LC encouraged  MOB to stroke her nipple from infant's nose to chin to initiate a gaping mouth. Infant yawned very wide while MOB was attempting this. MOB was too late to put her nipple in infant's mouth. LC encouraged MOB to bring infant to the breast even if she is yawning. LC believes that infant can put her entire nipple in the mouth after seeing infant yawn. The blister was probably caused do to improper positioning. LC encouraged MOB to call again for further assistance as needed.  Maternal Data Has patient been taught Hand Expression?: Yes Does the patient have breastfeeding experience prior to this delivery?: No  Feeding Mother's Current Feeding Choice: Breast Milk and Donor Milk Nipple Type: Slow - flow  LATCH Score Latch: Too sleepy or reluctant, no latch achieved, no sucking elicited.  Audible Swallowing: None  Type of Nipple: Everted at rest and after stimulation  Comfort (Breast/Nipple): Soft / non-tender  Hold (Positioning): Full assist, staff holds infant at breast  LATCH  Score: 4   Lactation Tools Discussed/Used Tools: Pump;Flanges Flange Size: 18 Breast pump type: Double-Electric Breast Pump;Manual Pump Education: Setup, frequency, and cleaning;Milk Storage Reason for Pumping: MOB request Pumping frequency: 15-20 min every 2-3 hrs as needed  Interventions Interventions: Breast feeding basics reviewed;Assisted with latch;Breast compression;Adjust position;Support pillows;Position options;Hand pump;DEBP;Education;LC Services brochure  Discharge Discharge Education: Warning signs for feeding baby;Engorgement and breast care Pump: DEBP;Personal (Spectra)  Consult Status Consult Status: Follow-up Date: 09/14/23 Follow-up type: In-patient    Dema Severin BS, IBCLC 09/13/2023, 8:57 PM

## 2023-09-14 MED ORDER — IBUPROFEN 600 MG PO TABS: 600.0000 mg | ORAL_TABLET | Freq: Four times a day (QID) | ORAL | 1 refills | Status: AC

## 2023-09-14 MED ORDER — ACETAMINOPHEN 500 MG PO TABS: 1000.0000 mg | ORAL_TABLET | Freq: Four times a day (QID) | ORAL | 1 refills | Status: AC

## 2023-09-14 MED ORDER — OXYCODONE HCL 5 MG PO TABS: 5.0000 mg | ORAL_TABLET | Freq: Four times a day (QID) | ORAL | 0 refills | Status: AC | PRN

## 2023-09-14 NOTE — Lactation Note (Signed)
This note was copied from a baby's chart. Lactation Consultation Note  Patient Name: Melinda Meyer [id number] Date: 09/14/2023 Age:25 hours Reason for consult: Follow-up assessment;1st time breastfeeding;Nipple pain/trauma;Early term 37-38.6wks;Mother's request  P1, Mother has abrasions on tips of nipples.  L nipple has a positional crack.  Mother wanted assistance with latching stating baby does not open mouth wide enough. Mother has been latching in football hold. Suggest cross cradle hold to be able to view latching. Had mother prepump with manual pump fitted with 24 mm flange and hand express. After a few attempts baby was able to achieve a deeper latch and sustained with comfortable latch for more than 10 min. Suggest applying ebm or coconut oil to nipple for soreness. Suggest pumping on L breast for 10 min if not latching and attempt again on L breast later today.  Mother will call for help as needed.    Maternal Data Has patient been taught Hand Expression?: Yes Does the patient have breastfeeding experience prior to this delivery?: No  Feeding Mother's Current Feeding Choice: Breast Milk and Formula Nipple Type: Slow - flow  LATCH Score Latch: Grasps breast easily, tongue down, lips flanged, rhythmical sucking.  Audible Swallowing: A few with stimulation  Type of Nipple: Everted at rest and after stimulation (short shaft)  Comfort (Breast/Nipple): Filling, red/small blisters or bruises, mild/mod discomfort  Hold (Positioning): Assistance needed to correctly position infant at breast and maintain latch.  LATCH Score: 7   Lactation Tools Discussed/Used Tools: Pump;Flanges;Coconut oil Reason for Pumping: prepump Pumping frequency: PRN  Interventions Interventions: Breast feeding basics reviewed;Assisted with latch;Hand express;Pre-pump if needed;Hand pump;Coconut oil;Education  Discharge Pump: Personal;DEBP  Consult Status Consult Status: Follow-up Date:  09/15/23 Follow-up type: In-patient    Dahlia Byes [facility] 09/14/2023, 12:56 PM

## 2023-09-16 ENCOUNTER — Encounter (HOSPITAL_COMMUNITY)
Admission: RE | Admit: 2023-09-16 | Discharge: 2023-09-16 | Disposition: A | Discharge status: Home / Self Care | Payer: Managed Care, Other (non HMO) | Source: Ambulatory Visit | Attending: Family Medicine | Admitting: Family Medicine

## 2023-09-17 ENCOUNTER — Inpatient Hospital Stay (HOSPITAL_COMMUNITY)
Admission: RE | Admit: 2023-09-17 | Payer: Managed Care, Other (non HMO) | Source: Home / Self Care | Admitting: Family Medicine

## 2023-09-20 ENCOUNTER — Encounter: Payer: Managed Care, Other (non HMO) | Admitting: Family Medicine

## 2023-09-21 ENCOUNTER — Other Ambulatory Visit: Payer: Self-pay

## 2023-09-21 ENCOUNTER — Ambulatory Visit: Payer: Managed Care, Other (non HMO)

## 2023-09-21 VITALS — BP 122/72 | HR 66 | Ht 68.0 in | Wt 183.0 lb

## 2023-09-21 DIAGNOSIS — Z4889 Encounter for other specified surgical aftercare: Secondary | ICD-10-CM

## 2023-09-21 NOTE — Progress Notes (Signed)
 Incision Check Visit  Melinda Meyer is here for incision check following primary c-section on 12/22.   Assessment: Incision looks clean, dry, intact and well approximated. No s/s of infection.  Education: Reviewed good daily wound care and s/s of infection with patient.  Patient will follow up post partum visit . Patient confirmed scheduled appointment.  Rosaline Pendleton, RN 09/21/2023  3:13 PM

## 2023-10-13 ENCOUNTER — Ambulatory Visit: Payer: Managed Care, Other (non HMO) | Admitting: Family Medicine

## 2023-10-13 ENCOUNTER — Other Ambulatory Visit: Payer: Self-pay

## 2023-10-13 NOTE — Progress Notes (Signed)
    Post Partum Visit Note  Melinda Meyer is a 28 y.o. G67P1001 female who presents for a postpartum visit. She is 4 weeks postpartum following a low transverse ceserean.  I have fully reviewed the prenatal and intrapartum course. The delivery was at 38/3 gestational weeks.  Anesthesia: spinal. Postpartum course has been uncomplicated. Baby is doing well. Baby is feeding by breast. Bleeding staining only. Bowel function is normal. Bladder function is normal. Patient is not sexually active. Contraception method is none. Postpartum depression screening: negative.   The pregnancy intention screening data noted above was reviewed. Potential methods of contraception were discussed. The patient elected to proceed with No data recorded.    Health Maintenance Due  Topic Date Due   HPV VACCINES (1 - 3-dose series) Never done   INFLUENZA VACCINE  Never done    The following portions of the patient's history were reviewed and updated as appropriate: allergies, current medications, past family history, past medical history, past social history, past surgical history, and problem list.  Review of Systems Pertinent items are noted in HPI.  Objective:  BP 128/76   Pulse 74   Wt 170 lb 5 oz (77.3 kg)   LMP  (LMP Unknown)   BMI 25.90 kg/m    General:  alert, cooperative, and appears stated age   Breasts:  not indicated  Lungs: clear to auscultation bilaterally  Heart:  regular rate and rhythm, S1, S2 normal, no murmur, click, rub or gallop  Abdomen: soft, non-tender; bowel sounds normal; no masses,  no organomegaly   Wound well approximated incision  GU exam:  not indicated       Assessment:   normal postpartum exam.   Plan:   Essential components of care per ACOG recommendations:  1.  Mood and well being: Patient with negative depression screening today. Reviewed local resources for support.  - Patient tobacco use? No.   - hx of drug use? No.    2. Infant care and feeding:   -Patient currently breastmilk feeding? Yes. Reviewed importance of draining breast regularly to support lactation.  -Social determinants of health (SDOH) reviewed in EPIC. No concerns  3. Sexuality, contraception and birth spacing - Patient does not want a pregnancy in the next year.  Desired family size is 2 children.  - Reviewed reproductive life planning. Reviewed contraceptive methods based on pt preferences and effectiveness.  Patient desired Abstinence today.   - Discussed birth spacing of 18 months  4. Sleep and fatigue -Encouraged family/partner/community support of 4 hrs of uninterrupted sleep to help with mood and fatigue  5. Physical Recovery  - Discussed patients delivery and complications. She describes her labor as good. - Patient had a C-section, no problems at delivery. Due to BREECH, failed ECV - Patient has urinary incontinence? No. - Patient is safe to resume physical and sexual activity  6.  Health Maintenance - HM due items addressed Yes - Last pap smear  Diagnosis  Date Value Ref Range Status  03/10/2023 - Low grade squamous intraepithelial lesion (LSIL) (A)  Final   Pap smear not done at today's visit.  -Breast Cancer screening indicated? No.   7. Chronic Disease/Pregnancy Condition follow up: None  - PCP follow up  Federico Flake, MD Center for [facility], [facility]

## 2023-10-21 ENCOUNTER — Ambulatory Visit: Payer: Managed Care, Other (non HMO) | Admitting: Obstetrics and Gynecology

## 2023-11-09 ENCOUNTER — Encounter: Payer: Self-pay | Admitting: Family Medicine

## 2023-11-10 ENCOUNTER — Encounter: Payer: Managed Care, Other (non HMO) | Admitting: Family Medicine

## 2023-11-24 ENCOUNTER — Ambulatory Visit: Payer: Managed Care, Other (non HMO) | Admitting: Family Medicine

## 2023-11-24 ENCOUNTER — Encounter: Payer: Self-pay | Admitting: Family Medicine

## 2023-11-24 ENCOUNTER — Other Ambulatory Visit: Payer: Self-pay

## 2023-11-24 VITALS — BP 139/81 | HR 76 | Wt 164.0 lb

## 2023-11-24 DIAGNOSIS — Z30011 Encounter for initial prescription of contraceptive pills: Secondary | ICD-10-CM | POA: Diagnosis not present

## 2023-11-24 DIAGNOSIS — K640 First degree hemorrhoids: Secondary | ICD-10-CM

## 2023-11-24 MED ORDER — NORETHINDRONE 0.35 MG PO TABS
1.0000 | ORAL_TABLET | Freq: Every day | ORAL | 4 refills | Status: AC
Start: 2023-11-24 — End: ?

## 2023-11-24 NOTE — Progress Notes (Signed)
    Contraception/Family Planning VISIT ENCOUNTER NOTE  Subjective:   Melinda Meyer is a 28 y.o. G69P1001 female here for reproductive life counseling.  Desires no effect on her milk supply from [facility].  Reports she does not want a pregnancy in the next year.   She is breastfeeding regularly.   Denies abnormal vaginal bleeding, discharge, pelvic pain, problems with intercourse or other gynecologic concerns.    Gynecologic History Patient's last menstrual period was 11/08/2023. Contraception: condoms  Health Maintenance Due  Topic Date Due   HPV VACCINES (1 - 3-dose series) Never done   INFLUENZA VACCINE  Never done   COVID-19 Vaccine (3 - 2024-25 season) 05/23/2023   The following portions of the patient's history were reviewed and updated as appropriate: allergies, current medications, past family history, past medical history, past social history, past surgical history and problem list.  Review of Systems Pertinent items are noted in HPI.   Objective:  BP 139/81   Pulse 76   Wt 164 lb (74.4 kg)   LMP 11/08/2023   Breastfeeding Yes   BMI 24.94 kg/m  Gen: well appearing, NAD HEENT: no scleral icterus CV: RR Lung: Normal WOB Ext: warm well perfused  GU: Normal appearing external genitalia; normal appearing vaginal mucosa and cervix.  No abnormal discharge noted. + small hemorrhoid at 6 oclock   Assessment and Plan:   Contraception counseling: Reviewed all forms of birth control options available including abstinence; over the counter/barrier methods; hormonal contraceptive medication including pill, patch, ring, injection,contraceptive implant; hormonal and nonhormonal IUDs; permanent sterilization options including vasectomy and the various tubal sterilization modalities. Risks and benefits reviewed.  Questions were answered.  Written information was also given to the patient to review.  Patient desires POP, this was prescribed for patient. She will follow up in  1 year for  surveillance.  She was told to call with any further questions, or with any concerns about this method of contraception.  Emphasized use of condoms 100% of the time for STI prevention.   1. Encounter for initial prescription of contraceptive pills (Primary) - norethindrone (MICRONOR) 0.35 MG tablet; Take 1 tablet (0.35 mg total) by mouth daily.  Dispense: 84 tablet; Refill: 4  2. Grade I hemorrhoids Minor, recommended preparation H  Please refer to After Visit Summary for other counseling recommendations.   Return in about 1 year (around 11/23/2024) for Yearly wellness exam.  Federico Flake, MD, MPH, ABFM Attending Physician Faculty Practice- Center for [facility]

## 2024-01-11 ENCOUNTER — Encounter: Payer: Self-pay | Admitting: Family Medicine

## 2024-04-21 ENCOUNTER — Encounter: Payer: Self-pay | Admitting: Family Medicine

## 2024-05-08 ENCOUNTER — Encounter: Payer: Self-pay | Admitting: Family Medicine

## 2024-05-17 ENCOUNTER — Encounter: Admitting: Family Medicine

## 2024-06-19 ENCOUNTER — Other Ambulatory Visit (HOSPITAL_COMMUNITY)
Admission: RE | Admit: 2024-06-19 | Discharge: 2024-06-19 | Disposition: A | Discharge status: Home / Self Care | Source: Ambulatory Visit | Attending: Family Medicine | Admitting: Family Medicine

## 2024-06-19 ENCOUNTER — Other Ambulatory Visit: Payer: Self-pay

## 2024-06-19 ENCOUNTER — Telehealth: Payer: Self-pay | Admitting: Family Medicine

## 2024-06-19 ENCOUNTER — Encounter: Payer: Self-pay | Admitting: Family Medicine

## 2024-06-19 ENCOUNTER — Ambulatory Visit: Admitting: Family Medicine

## 2024-06-19 VITALS — BP 117/77 | HR 58 | Wt 152.6 lb

## 2024-06-19 DIAGNOSIS — Z01419 Encounter for gynecological examination (general) (routine) without abnormal findings: Secondary | ICD-10-CM

## 2024-06-19 DIAGNOSIS — Z1332 Encounter for screening for maternal depression: Secondary | ICD-10-CM | POA: Diagnosis not present

## 2024-06-19 DIAGNOSIS — Z0142 Encounter for cervical smear to confirm findings of recent normal smear following initial abnormal smear: Secondary | ICD-10-CM

## 2024-06-19 DIAGNOSIS — Z8742 Personal history of other diseases of the female genital tract: Secondary | ICD-10-CM

## 2024-06-19 DIAGNOSIS — Z113 Encounter for screening for infections with a predominantly sexual mode of transmission: Secondary | ICD-10-CM | POA: Insufficient documentation

## 2024-06-19 DIAGNOSIS — N9089 Other specified noninflammatory disorders of vulva and perineum: Secondary | ICD-10-CM | POA: Diagnosis not present

## 2024-06-19 NOTE — Telephone Encounter (Signed)
 Called patient to confirm her appointment today at 2:15 but she didn't answer the call. I left her a detailed message with our call back number asking to give us  a call to confirm or reschedule her appointment.

## 2024-06-19 NOTE — Progress Notes (Signed)
 GYNECOLOGY ANNUAL PREVENTATIVE CARE ENCOUNTER NOTE  Subjective:  Karren Kommer is a 28 y.o. G31P1001 female here for a routine annual gynecologic exam.  Current complaints: skin tags, vaginal dryness.   Is exclusively breastfeeding.  Infant is 8 months.  Denies abnormal vaginal bleeding, discharge, pelvic pain, problems with intercourse or other gynecologic concerns.    Gynecologic History Patient's last menstrual period was 06/08/2024 (exact date). Contraception: oral progesterone-only contraceptive Last Pap: 03/10/23. Results were: LSIL Last mammogram: NA.  Health Maintenance Due  Topic Date Due   HPV VACCINES (1 - 3-dose series) Never done   Influenza Vaccine  Never done   COVID-19 Vaccine (3 - 2025-26 season) 05/22/2024    The following portions of the patient's history were reviewed and updated as appropriate: allergies, current medications, past family history, past medical history, past social history, past surgical history and problem list.  Review of Systems Pertinent items are noted in HPI.   Objective:  BP 117/77   Pulse (!) 58   Wt 152 lb 9.6 oz (69.2 kg)   LMP 06/08/2024 (Exact Date)   Breastfeeding Yes   BMI 23.20 kg/m  CONSTITUTIONAL: Well-developed, well-nourished female in no acute distress.  HENT:  Normocephalic, atraumatic, External right and left ear normal. Oropharynx is clear and moist EYES:  No scleral icterus.  NECK: Normal range of motion, supple, no masses.  Normal thyroid.  SKIN: Skin is warm and dry. No rash noted. Not diaphoretic. No erythema. No pallor. NEUROLOGIC: Alert and oriented to person, place, and time. Normal reflexes, muscle tone coordination. No cranial nerve deficit noted. PSYCHIATRIC: Normal mood and affect. Normal behavior. Normal judgment and thought content. CARDIOVASCULAR: Normal heart rate noted, regular rhythm. 2+ distal pulses. RESPIRATORY: Effort and breath sounds normal, no problems with respiration noted. BREASTS: not  examined ABDOMEN: Soft,  no distention noted.  No tenderness, rebound or guarding.  PELVIC: Normal appearing external genitalia---does have some <73mm kin tages, one on upper right labia and one on lower right labia; normal appearing vaginal mucosa and cervix.  No abnormal discharge noted.  Pap smear obtained.  Normal uterine size, no other palpable masses, no uterine or adnexal tenderness. Chaperone present for exam MUSCULOSKELETAL: Normal range of motion.   Assessment and Plan:  1) Annual gynecologic examination with pap smear:  Will follow up results of pap smear and manage accordingly. STI screen also ordered today.  Routine preventative health maintenance measures emphasized.  2) Contraception counseling: Reviewed all forms of birth control options available including abstinence; over the counter/barrier methods; hormonal contraceptive medication including pill, patch, ring, injection,contraceptive implant; hormonal and nonhormonal IUDs; permanent sterilization options including vasectomy and the various tubal sterilization modalities. Risks and benefits reviewed.  Questions were answered.  Written information was also given to the patient to review.  Patient desires POP, this was prescribed for patient. She will follow up in  52yr for surveillance.  She was told to call with any further questions, or with any concerns about this method of contraception.  Emphasized use of condoms 100% of the time for STI prevention.  1. Pap smear to confirm normal after abnormal result (Primary) - Cytology - PAP  2. Encounter for screening examination for sexually transmitted disease - Cytology - PAP - HIV antibody (with reflex) - RPR - Hepatitis B Surface AntiGEN - Hepatitis C Antibody - Cervicovaginal ancillary only( [place])    Please refer to After Visit Summary for other counseling recommendations.   No follow-ups on file.  Suzen  Maryan Masters, MD, MPH, ABFM Att[facility] for  [facility]

## 2024-06-20 LAB — CERVICOVAGINAL ANCILLARY ONLY
Bacterial Vaginitis (gardnerella): POSITIVE — AB
Candida Glabrata: NEGATIVE
Candida Vaginitis: NEGATIVE
Chlamydia: NEGATIVE
Comment: NEGATIVE
Comment: NEGATIVE
Comment: NEGATIVE
Comment: NEGATIVE
Comment: NEGATIVE
Comment: NORMAL
Neisseria Gonorrhea: NEGATIVE
Trichomonas: NEGATIVE

## 2024-06-21 ENCOUNTER — Ambulatory Visit: Payer: Self-pay | Admitting: Family Medicine

## 2024-06-21 DIAGNOSIS — B9689 Other specified bacterial agents as the cause of diseases classified elsewhere: Secondary | ICD-10-CM

## 2024-06-21 LAB — CYTOLOGY - PAP
Chlamydia: NEGATIVE
Comment: NEGATIVE
Comment: NEGATIVE
Comment: NORMAL
Diagnosis: NEGATIVE
Neisseria Gonorrhea: NEGATIVE
Trichomonas: NEGATIVE

## 2024-06-21 MED ORDER — METRONIDAZOLE 500 MG PO TABS: 500.0000 mg | ORAL_TABLET | Freq: Two times a day (BID) | ORAL | 0 refills | Status: DC

## 2024-07-04 ENCOUNTER — Encounter: Payer: Self-pay | Admitting: Family Medicine

## 2024-07-05 ENCOUNTER — Encounter: Payer: Self-pay | Admitting: Family Medicine

## 2024-07-05 DIAGNOSIS — B9689 Other specified bacterial agents as the cause of diseases classified elsewhere: Secondary | ICD-10-CM

## 2024-07-05 MED ORDER — METRONIDAZOLE 500 MG PO TABS: 500.0000 mg | ORAL_TABLET | Freq: Two times a day (BID) | ORAL | 0 refills | Status: AC
